# Patient Record
Sex: Female | Born: 1945 | Race: Black or African American | Hispanic: No | Smoking: Never smoker
Health system: Southern US, Community
[De-identification: ages and names within clinical notes are randomized; demographics above are authoritative.]

## PROBLEM LIST (undated history)

## (undated) DIAGNOSIS — Z9889 Other specified postprocedural states: Secondary | ICD-10-CM

## (undated) DIAGNOSIS — R519 Headache, unspecified: Secondary | ICD-10-CM

## (undated) DIAGNOSIS — M1990 Unspecified osteoarthritis, unspecified site: Secondary | ICD-10-CM

## (undated) DIAGNOSIS — J189 Pneumonia, unspecified organism: Secondary | ICD-10-CM

## (undated) DIAGNOSIS — K8020 Calculus of gallbladder without cholecystitis without obstruction: Secondary | ICD-10-CM

## (undated) DIAGNOSIS — E785 Hyperlipidemia, unspecified: Secondary | ICD-10-CM

## (undated) DIAGNOSIS — J811 Chronic pulmonary edema: Secondary | ICD-10-CM

## (undated) DIAGNOSIS — K219 Gastro-esophageal reflux disease without esophagitis: Secondary | ICD-10-CM

## (undated) DIAGNOSIS — T4145XA Adverse effect of unspecified anesthetic, initial encounter: Secondary | ICD-10-CM

## (undated) DIAGNOSIS — D649 Anemia, unspecified: Secondary | ICD-10-CM

## (undated) DIAGNOSIS — R7303 Prediabetes: Secondary | ICD-10-CM

## (undated) DIAGNOSIS — I10 Essential (primary) hypertension: Secondary | ICD-10-CM

## (undated) DIAGNOSIS — R112 Nausea with vomiting, unspecified: Secondary | ICD-10-CM

## (undated) DIAGNOSIS — F419 Anxiety disorder, unspecified: Secondary | ICD-10-CM

## (undated) DIAGNOSIS — T8859XA Other complications of anesthesia, initial encounter: Secondary | ICD-10-CM

## (undated) DIAGNOSIS — R51 Headache: Secondary | ICD-10-CM

## (undated) DIAGNOSIS — Z8719 Personal history of other diseases of the digestive system: Secondary | ICD-10-CM

## (undated) HISTORY — DX: Anxiety disorder, unspecified: F41.9

## (undated) HISTORY — PX: BREAST EXCISIONAL BIOPSY: SUR124

## (undated) HISTORY — PX: COLONOSCOPY WITH ESOPHAGOGASTRODUODENOSCOPY (EGD): SHX5779

## (undated) HISTORY — DX: Prediabetes: R73.03

## (undated) HISTORY — PX: UTERINE FIBROID SURGERY: SHX826

## (undated) HISTORY — DX: Hyperlipidemia, unspecified: E78.5

## (undated) HISTORY — DX: Calculus of gallbladder without cholecystitis without obstruction: K80.20

## (undated) HISTORY — PX: FEMUR FRACTURE SURGERY: SHX633

---

## 1999-07-12 ENCOUNTER — Encounter: Admission: RE | Admit: 1999-07-12 | Discharge: 1999-07-12 | Payer: Self-pay | Admitting: Obstetrics and Gynecology

## 1999-08-14 ENCOUNTER — Other Ambulatory Visit: Admission: RE | Admit: 1999-08-14 | Discharge: 1999-08-14 | Payer: Self-pay | Admitting: Family Medicine

## 2000-01-25 ENCOUNTER — Other Ambulatory Visit: Admission: RE | Admit: 2000-01-25 | Discharge: 2000-01-25 | Payer: Self-pay | Admitting: Obstetrics and Gynecology

## 2001-09-15 ENCOUNTER — Other Ambulatory Visit: Admission: RE | Admit: 2001-09-15 | Discharge: 2001-09-15 | Payer: Self-pay | Admitting: Obstetrics and Gynecology

## 2004-02-28 ENCOUNTER — Other Ambulatory Visit: Admission: RE | Admit: 2004-02-28 | Discharge: 2004-02-28 | Payer: Self-pay | Admitting: Obstetrics and Gynecology

## 2004-07-22 ENCOUNTER — Ambulatory Visit (HOSPITAL_COMMUNITY): Admission: RE | Admit: 2004-07-22 | Discharge: 2004-07-22 | Payer: Self-pay | Admitting: Cardiology

## 2004-07-23 ENCOUNTER — Ambulatory Visit (HOSPITAL_COMMUNITY): Admission: RE | Admit: 2004-07-23 | Discharge: 2004-07-23 | Payer: Self-pay | Admitting: Cardiology

## 2005-10-22 ENCOUNTER — Other Ambulatory Visit: Admission: RE | Admit: 2005-10-22 | Discharge: 2005-10-22 | Payer: Self-pay | Admitting: Obstetrics and Gynecology

## 2005-11-01 ENCOUNTER — Emergency Department (HOSPITAL_COMMUNITY): Admission: EM | Admit: 2005-11-01 | Discharge: 2005-11-01 | Payer: Self-pay | Admitting: Emergency Medicine

## 2006-02-17 ENCOUNTER — Encounter: Admission: RE | Admit: 2006-02-17 | Discharge: 2006-02-17 | Payer: Self-pay | Admitting: Cardiology

## 2006-03-03 ENCOUNTER — Encounter: Admission: RE | Admit: 2006-03-03 | Discharge: 2006-03-03 | Payer: Self-pay | Admitting: Cardiology

## 2006-07-29 ENCOUNTER — Encounter: Admission: RE | Admit: 2006-07-29 | Discharge: 2006-07-29 | Payer: Self-pay | Admitting: Cardiology

## 2007-09-11 ENCOUNTER — Encounter: Admission: RE | Admit: 2007-09-11 | Discharge: 2007-09-11 | Payer: Self-pay | Admitting: Cardiology

## 2009-01-21 ENCOUNTER — Emergency Department (HOSPITAL_COMMUNITY): Admission: EM | Admit: 2009-01-21 | Discharge: 2009-01-21 | Payer: Self-pay | Admitting: Emergency Medicine

## 2009-07-07 ENCOUNTER — Encounter: Admission: RE | Admit: 2009-07-07 | Discharge: 2009-07-07 | Payer: Self-pay | Admitting: Cardiology

## 2010-07-01 ENCOUNTER — Emergency Department (HOSPITAL_COMMUNITY): Admission: EM | Admit: 2010-07-01 | Discharge: 2010-07-01 | Payer: Self-pay | Admitting: Emergency Medicine

## 2013-01-29 ENCOUNTER — Other Ambulatory Visit: Payer: Self-pay | Admitting: Cardiology

## 2013-01-29 ENCOUNTER — Ambulatory Visit
Admission: RE | Admit: 2013-01-29 | Discharge: 2013-01-29 | Disposition: A | Discharge status: Home / Self Care | Payer: BC Managed Care – PPO | Source: Ambulatory Visit | Attending: Cardiology | Admitting: Cardiology

## 2013-01-29 DIAGNOSIS — R109 Unspecified abdominal pain: Secondary | ICD-10-CM

## 2013-01-29 DIAGNOSIS — R197 Diarrhea, unspecified: Secondary | ICD-10-CM

## 2013-01-29 DIAGNOSIS — R1110 Vomiting, unspecified: Secondary | ICD-10-CM

## 2013-01-29 DIAGNOSIS — F419 Anxiety disorder, unspecified: Secondary | ICD-10-CM

## 2013-02-02 ENCOUNTER — Inpatient Hospital Stay (HOSPITAL_COMMUNITY)
Admission: EM | Admit: 2013-02-02 | Discharge: 2013-02-05 | DRG: 247 | Disposition: A | Discharge status: Home / Self Care | Payer: BC Managed Care – PPO | Attending: Internal Medicine | Admitting: Internal Medicine

## 2013-02-02 ENCOUNTER — Emergency Department (HOSPITAL_COMMUNITY): Payer: BC Managed Care – PPO

## 2013-02-02 ENCOUNTER — Encounter (HOSPITAL_COMMUNITY): Payer: Self-pay | Admitting: *Deleted

## 2013-02-02 DIAGNOSIS — R197 Diarrhea, unspecified: Secondary | ICD-10-CM | POA: Diagnosis present

## 2013-02-02 DIAGNOSIS — R112 Nausea with vomiting, unspecified: Secondary | ICD-10-CM | POA: Diagnosis present

## 2013-02-02 DIAGNOSIS — I10 Essential (primary) hypertension: Secondary | ICD-10-CM | POA: Diagnosis present

## 2013-02-02 DIAGNOSIS — IMO0002 Reserved for concepts with insufficient information to code with codable children: Secondary | ICD-10-CM | POA: Diagnosis present

## 2013-02-02 DIAGNOSIS — E876 Hypokalemia: Secondary | ICD-10-CM | POA: Diagnosis present

## 2013-02-02 DIAGNOSIS — R0600 Dyspnea, unspecified: Secondary | ICD-10-CM

## 2013-02-02 DIAGNOSIS — R079 Chest pain, unspecified: Secondary | ICD-10-CM | POA: Diagnosis present

## 2013-02-02 DIAGNOSIS — R05 Cough: Secondary | ICD-10-CM | POA: Diagnosis present

## 2013-02-02 DIAGNOSIS — R110 Nausea: Secondary | ICD-10-CM | POA: Diagnosis present

## 2013-02-02 DIAGNOSIS — M79642 Pain in left hand: Secondary | ICD-10-CM | POA: Diagnosis present

## 2013-02-02 DIAGNOSIS — F17200 Nicotine dependence, unspecified, uncomplicated: Secondary | ICD-10-CM | POA: Diagnosis present

## 2013-02-02 DIAGNOSIS — R059 Cough, unspecified: Secondary | ICD-10-CM | POA: Diagnosis present

## 2013-02-02 DIAGNOSIS — M79609 Pain in unspecified limb: Principal | ICD-10-CM | POA: Diagnosis present

## 2013-02-02 DIAGNOSIS — Z79899 Other long term (current) drug therapy: Secondary | ICD-10-CM

## 2013-02-02 HISTORY — DX: Chronic pulmonary edema: J81.1

## 2013-02-02 HISTORY — DX: Essential (primary) hypertension: I10

## 2013-02-02 MED ORDER — SODIUM CHLORIDE 0.9 % IV SOLN
80.0000 mg | Freq: Once | INTRAVENOUS | Status: AC
  Administered 2013-02-03: 80 mg via INTRAVENOUS
  Filled 2013-02-02: qty 80

## 2013-02-02 MED ORDER — ASPIRIN 81 MG PO CHEW
324.0000 mg | CHEWABLE_TABLET | Freq: Once | ORAL | Status: AC
  Administered 2013-02-03: 324 mg via ORAL
  Filled 2013-02-02: qty 4

## 2013-02-02 MED ORDER — ONDANSETRON HCL 4 MG/2ML IJ SOLN
4.0000 mg | Freq: Once | INTRAMUSCULAR | Status: AC
  Administered 2013-02-03: 4 mg via INTRAVENOUS
  Filled 2013-02-02: qty 2

## 2013-02-02 MED ORDER — NITROGLYCERIN 0.4 MG SL SUBL
0.4000 mg | SUBLINGUAL_TABLET | SUBLINGUAL | Status: DC | PRN
  Administered 2013-02-03: 0.4 mg via SUBLINGUAL
  Filled 2013-02-02: qty 25

## 2013-02-02 NOTE — ED Notes (Addendum)
Pt c/o left arm pain, and SOB. Also reports feeling dizzy x's past couple days and felt her blood pressure was high. Pt states she has been vomiting and not able to keep BP meds down.

## 2013-02-03 ENCOUNTER — Observation Stay (HOSPITAL_COMMUNITY): Payer: BC Managed Care – PPO

## 2013-02-03 ENCOUNTER — Encounter (HOSPITAL_COMMUNITY): Payer: Self-pay | Admitting: Internal Medicine

## 2013-02-03 DIAGNOSIS — E876 Hypokalemia: Secondary | ICD-10-CM

## 2013-02-03 DIAGNOSIS — R05 Cough: Secondary | ICD-10-CM | POA: Diagnosis present

## 2013-02-03 DIAGNOSIS — R059 Cough, unspecified: Secondary | ICD-10-CM | POA: Diagnosis present

## 2013-02-03 DIAGNOSIS — I10 Essential (primary) hypertension: Secondary | ICD-10-CM | POA: Diagnosis present

## 2013-02-03 DIAGNOSIS — M79642 Pain in left hand: Secondary | ICD-10-CM | POA: Diagnosis present

## 2013-02-03 DIAGNOSIS — R079 Chest pain, unspecified: Secondary | ICD-10-CM | POA: Diagnosis present

## 2013-02-03 DIAGNOSIS — R112 Nausea with vomiting, unspecified: Secondary | ICD-10-CM

## 2013-02-03 LAB — BASIC METABOLIC PANEL
CO2: 33 mEq/L — ABNORMAL HIGH (ref 19–32)
Chloride: 102 mEq/L (ref 96–112)
Glucose, Bld: 101 mg/dL — ABNORMAL HIGH (ref 70–99)
Potassium: 3.2 mEq/L — ABNORMAL LOW (ref 3.5–5.1)
Sodium: 142 mEq/L (ref 135–145)

## 2013-02-03 LAB — COMPREHENSIVE METABOLIC PANEL
ALT: 21 U/L (ref 0–35)
AST: 30 U/L (ref 0–37)
Albumin: 3.4 g/dL — ABNORMAL LOW (ref 3.5–5.2)
Alkaline Phosphatase: 74 U/L (ref 39–117)
BUN: 6 mg/dL (ref 6–23)
CO2: 35 mEq/L — ABNORMAL HIGH (ref 19–32)
Calcium: 9.3 mg/dL (ref 8.4–10.5)
Chloride: 98 mEq/L (ref 96–112)
Creatinine, Ser: 0.69 mg/dL (ref 0.50–1.10)
GFR calc Af Amer: >90 mL/min (ref >90)
GFR calc non Af Amer: >90 mL/min (ref >90)
Glucose, Bld: 100 mg/dL — ABNORMAL HIGH (ref 70–99)
Potassium: 2.4 mEq/L — CL (ref 3.5–5.1)
Sodium: 141 mEq/L (ref 135–145)
Total Bilirubin: 0.3 mg/dL (ref 0.3–1.2)
Total Protein: 7 g/dL (ref 6.0–8.3)

## 2013-02-03 LAB — CBC WITH DIFFERENTIAL/PLATELET
Basophils Absolute: 0 10*3/uL (ref 0.0–0.1)
Basophils Relative: 1 % (ref 0–1)
Eosinophils Absolute: 0.1 10*3/uL (ref 0.0–0.7)
Eosinophils Relative: 1 % (ref 0–5)
HCT: 38.2 % (ref 36.0–46.0)
Hemoglobin: 12.3 g/dL (ref 12.0–15.0)
Lymphocytes Relative: 23 % (ref 12–46)
Lymphs Abs: 1.5 10*3/uL (ref 0.7–4.0)
MCH: 27.8 pg (ref 26.0–34.0)
MCHC: 32.2 g/dL (ref 30.0–36.0)
MCV: 86.4 fL (ref 78.0–100.0)
Monocytes Absolute: 0.7 10*3/uL (ref 0.1–1.0)
Monocytes Relative: 11 % (ref 3–12)
Neutro Abs: 4.2 10*3/uL (ref 1.7–7.7)
Neutrophils Relative %: 65 % (ref 43–77)
Platelets: 259 10*3/uL (ref 150–400)
RBC: 4.42 MIL/uL (ref 3.87–5.11)
RDW: 14.4 % (ref 11.5–15.5)
WBC: 6.5 10*3/uL (ref 4.0–10.5)

## 2013-02-03 LAB — CBC
Hemoglobin: 11 g/dL — ABNORMAL LOW (ref 12.0–15.0)
MCH: 27.2 pg (ref 26.0–34.0)
MCV: 86.9 fL (ref 78.0–100.0)
RBC: 4.04 MIL/uL (ref 3.87–5.11)

## 2013-02-03 LAB — TROPONIN I: Troponin I: <0.3 ng/mL (ref <0.30)

## 2013-02-03 LAB — MAGNESIUM: Magnesium: 1.9 mg/dL (ref 1.5–2.5)

## 2013-02-03 MED ORDER — BENZONATATE 100 MG PO CAPS
100.0000 mg | ORAL_CAPSULE | Freq: Three times a day (TID) | ORAL | Status: DC | PRN
  Administered 2013-02-03 – 2013-02-05 (×2): 100 mg via ORAL
  Filled 2013-02-03 (×2): qty 1

## 2013-02-03 MED ORDER — POTASSIUM CHLORIDE CRYS ER 20 MEQ PO TBCR
40.0000 meq | EXTENDED_RELEASE_TABLET | Freq: Once | ORAL | Status: AC
  Administered 2013-02-03: 40 meq via ORAL
  Filled 2013-02-03: qty 2

## 2013-02-03 MED ORDER — ONDANSETRON HCL 4 MG PO TABS
4.0000 mg | ORAL_TABLET | Freq: Four times a day (QID) | ORAL | Status: DC | PRN
  Administered 2013-02-04: 4 mg via ORAL
  Filled 2013-02-03: qty 1

## 2013-02-03 MED ORDER — HYDROCHLOROTHIAZIDE 25 MG PO TABS
25.0000 mg | ORAL_TABLET | Freq: Every day | ORAL | Status: DC
  Administered 2013-02-03 – 2013-02-04 (×2): 25 mg via ORAL
  Filled 2013-02-03 (×2): qty 1

## 2013-02-03 MED ORDER — NEBIVOLOL HCL 10 MG PO TABS
10.0000 mg | ORAL_TABLET | Freq: Every day | ORAL | Status: DC
  Administered 2013-02-03 – 2013-02-05 (×3): 10 mg via ORAL
  Filled 2013-02-03 (×3): qty 1

## 2013-02-03 MED ORDER — ACETAMINOPHEN 325 MG PO TABS: 650.0000 mg | ORAL_TABLET | Freq: Four times a day (QID) | ORAL | Status: DC | PRN

## 2013-02-03 MED ORDER — HYDROMORPHONE HCL PF 1 MG/ML IJ SOLN: 0.5000 mg | INTRAMUSCULAR | Status: DC | PRN

## 2013-02-03 MED ORDER — NITROGLYCERIN 2 % TD OINT: 1.0000 [in_us] | TOPICAL_OINTMENT | Freq: Four times a day (QID) | TRANSDERMAL | Status: DC

## 2013-02-03 MED ORDER — OXYCODONE HCL 5 MG PO TABS
5.0000 mg | ORAL_TABLET | ORAL | Status: DC | PRN
  Administered 2013-02-04: 5 mg via ORAL
  Filled 2013-02-03: qty 1

## 2013-02-03 MED ORDER — ACETAMINOPHEN 650 MG RE SUPP: 650.0000 mg | Freq: Four times a day (QID) | RECTAL | Status: DC | PRN

## 2013-02-03 MED ORDER — POTASSIUM CHLORIDE IN NACL 40-0.9 MEQ/L-% IV SOLN
INTRAVENOUS | Status: DC
  Filled 2013-02-03 (×3): qty 1000

## 2013-02-03 MED ORDER — ENOXAPARIN SODIUM 40 MG/0.4ML ~~LOC~~ SOLN
40.0000 mg | SUBCUTANEOUS | Status: DC
  Administered 2013-02-03 – 2013-02-05 (×3): 40 mg via SUBCUTANEOUS
  Filled 2013-02-03 (×3): qty 0.4

## 2013-02-03 MED ORDER — NITROGLYCERIN 2 % TD OINT: 0.5000 [in_us] | TOPICAL_OINTMENT | Freq: Four times a day (QID) | TRANSDERMAL | Status: DC

## 2013-02-03 MED ORDER — ONDANSETRON HCL 4 MG/2ML IJ SOLN: 4.0000 mg | Freq: Four times a day (QID) | INTRAMUSCULAR | Status: DC | PRN

## 2013-02-03 MED ORDER — AMLODIPINE BESYLATE 5 MG PO TABS
5.0000 mg | ORAL_TABLET | Freq: Every day | ORAL | Status: DC
  Administered 2013-02-03 – 2013-02-05 (×3): 5 mg via ORAL
  Filled 2013-02-03 (×3): qty 1

## 2013-02-03 MED ORDER — VORTIOXETINE HBR 10 MG PO TABS: 10.0000 mg | ORAL_TABLET | Freq: Every day | ORAL | Status: DC

## 2013-02-03 MED ORDER — POTASSIUM CHLORIDE 10 MEQ/100ML IV SOLN
10.0000 meq | INTRAVENOUS | Status: AC
  Administered 2013-02-03 (×3): 10 meq via INTRAVENOUS
  Filled 2013-02-03 (×3): qty 100

## 2013-02-03 MED ORDER — ZOLPIDEM TARTRATE 5 MG PO TABS: 5.0000 mg | ORAL_TABLET | Freq: Every evening | ORAL | Status: DC | PRN

## 2013-02-03 MED ORDER — ALUM & MAG HYDROXIDE-SIMETH 200-200-20 MG/5ML PO SUSP
30.0000 mL | Freq: Four times a day (QID) | ORAL | Status: DC | PRN
  Administered 2013-02-05: 30 mL via ORAL
  Filled 2013-02-03: qty 30

## 2013-02-03 MED ORDER — AMLODIPINE-VALSARTAN-HCTZ 5-160-25 MG PO TABS: 1.0000 | ORAL_TABLET | Freq: Every day | ORAL | Status: DC

## 2013-02-03 MED ORDER — PANTOPRAZOLE SODIUM 40 MG PO TBEC
40.0000 mg | DELAYED_RELEASE_TABLET | Freq: Every day | ORAL | Status: DC
  Administered 2013-02-03 – 2013-02-05 (×3): 40 mg via ORAL
  Filled 2013-02-03 (×3): qty 1

## 2013-02-03 MED ORDER — CLONIDINE HCL 0.2 MG PO TABS
0.2000 mg | ORAL_TABLET | Freq: Every day | ORAL | Status: DC
  Administered 2013-02-03 – 2013-02-04 (×2): 0.2 mg via ORAL
  Filled 2013-02-03 (×3): qty 1

## 2013-02-03 MED ORDER — IRBESARTAN 150 MG PO TABS
150.0000 mg | ORAL_TABLET | Freq: Every day | ORAL | Status: DC
  Administered 2013-02-03 – 2013-02-05 (×3): 150 mg via ORAL
  Filled 2013-02-03 (×3): qty 1

## 2013-02-03 MED ORDER — POTASSIUM CHLORIDE 10 MEQ/100ML IV SOLN
10.0000 meq | INTRAVENOUS | Status: AC
  Filled 2013-02-03 (×2): qty 100

## 2013-02-03 NOTE — ED Provider Notes (Signed)
History     CSN: [id number]  Arrival date & time 02/02/13  2256   First MD Initiated Contact with Patient 02/02/13 2323      Chief Complaint  Patient presents with  . Shortness of Breath  . Arm Pain    (Consider location/radiation/quality/duration/timing/severity/associated sxs/prior treatment) HPI 67 year old female presents to emergency department from home with complaint of left arm pain radiating into her chest and shortness of breath.  Symptoms started this evening around 7:30, and has been persistent.  Patient has had 6-8 weeks of persistent nausea and vomiting.  She's been able to keep down soup and crackers, but reports every time she tries take regular food, she vomits.  She's had some loose stools.  She denies any fevers.  No travel, no unusual foods.  At the beginning of this illness, her grandson had a stomach bug, and she thought that she picked it up from him.  Patient was seen by her primary care Dr. yesterday.  He ordered labs and x-rays.  She was to followup with him today, but the arm and chest pain, worried her.  Patient is followed by Dr Shana Chute.  Patient has been dizzy over the last few days.  She's been unable to keep down her blood pressure medicines.  No prior history of coronary artery disease, no history of hyperlipidemia.  She reports she had pulmonary edema with her last pregnancy.  Past Medical History  Diagnosis Date  . Hypertension   . Pulmonary edema     Past Surgical History  Procedure Laterality Date  . Cesarean section    . Uterine fibroid surgery    . Femur fracture surgery      Family History  Problem Relation Age of Onset  . CAD Sister   . Diabetes Sister   . Cancer Maternal Aunt     History  Substance Use Topics  . Smoking status: Not on file  . Smokeless tobacco: Not on file  . Alcohol Use: No    OB History   Grav Para Term Preterm Abortions TAB SAB Ect Mult Living                  Review of Systems  Constitutional: Positive  for fatigue.  Respiratory: Positive for cough, chest tightness and shortness of breath.   Cardiovascular: Positive for chest pain. Negative for palpitations and leg swelling.  Gastrointestinal: Positive for nausea, vomiting and diarrhea.  Neurological: Positive for dizziness and light-headedness. Negative for speech difficulty and weakness.  All other systems reviewed and are negative.    Allergies  Review of patient's allergies indicates no known allergies.  Home Medications  No current outpatient prescriptions on file.  BP 161/85  Pulse 67  Temp(Src) 98 F (36.7 C) (Oral)  Resp 20  Ht 5\' 1"  (1.549 m)  Wt 171 lb 9.6 oz (77.837 kg)  BMI 32.44 kg/m2  SpO2 95%  Physical Exam  Nursing note and vitals reviewed. Constitutional: She is oriented to person, place, and time. She appears well-developed and well-nourished.  HENT:  Head: Normocephalic and atraumatic.  Right Ear: External ear normal.  Left Ear: External ear normal.  Nose: Nose normal.  Mouth/Throat: Oropharynx is clear and moist.  Dry mucous membranes  Eyes: Conjunctivae and EOM are normal. Pupils are equal, round, and reactive to light.  Neck: Normal range of motion. Neck supple. No JVD present. No tracheal deviation present. No thyromegaly present.  Cardiovascular: Normal rate, regular rhythm, normal heart sounds and intact distal pulses.  Exam reveals no gallop and no friction rub.   No murmur heard. Pulmonary/Chest: Effort normal and breath sounds normal. No stridor. No respiratory distress. She has no wheezes. She has no rales. She exhibits no tenderness.  Coughing noted  Abdominal: Soft. Bowel sounds are normal. She exhibits no distension and no mass. There is no tenderness. There is no rebound and no guarding.  Musculoskeletal: Normal range of motion. She exhibits no edema and no tenderness.  Lymphadenopathy:    She has no cervical adenopathy.  Neurological: She is alert and oriented to person, place, and time.  She has normal reflexes. No cranial nerve deficit. She exhibits normal muscle tone. Coordination normal.  Normal strength bilaterally in upper and lower extremities.  Neg romberg.  Skin: Skin is warm and dry. No rash noted. No erythema. No pallor.  Psychiatric: She has a normal mood and affect. Her behavior is normal. Judgment and thought content normal.    ED Course  Procedures (including critical care time)  Labs Reviewed  COMPREHENSIVE METABOLIC PANEL - Abnormal; Notable for the following:    Potassium 2.4 (*)    CO2 35 (*)    Glucose, Bld 100 (*)    Albumin 3.4 (*)    All other components within normal limits  BASIC METABOLIC PANEL - Abnormal; Notable for the following:    Potassium 3.2 (*)    CO2 33 (*)    Glucose, Bld 101 (*)    All other components within normal limits  CBC - Abnormal; Notable for the following:    Hemoglobin 11.0 (*)    HCT 35.1 (*)    All other components within normal limits  CBC WITH DIFFERENTIAL  TROPONIN I  LIPASE, BLOOD  MAGNESIUM  TROPONIN I   Dg Chest 2 View  02/02/2013  *RADIOLOGY REPORT*  Clinical Data: 67 year old female shortness of breath.  CHEST - 2 VIEW  Comparison: 07/07/2009 and earlier.  Findings: Lower lung volumes.  Mild diffuse increased interstitial opacity has progressed.  Cardiac size at the upper limits of normal. Other mediastinal contours are within normal limits. Visualized tracheal air column is within normal limits.  No pneumothorax, pleural effusion or confluent pulmonary opacity. No acute osseous abnormality identified.  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 No superimposed acute findings are identified.   Original Report Authenticated By: Erskine Speed, M.D.     Date: 02/03/2013  Rate: 75  Rhythm: normal sinus rhythm  QRS Axis: normal  Intervals: normal  ST/T Wave abnormalities: nonspecific ST/T changes  Conduction  Disutrbances:none  Narrative Interpretation: LVH  Old EKG Reviewed: none available    1. Chest pain   2. Dyspnea   3. Nausea and vomiting   4. Hypokalemia   5. Hypertension       MDM  67 yo female with left arm pain, chest pain sob with 6 weeks of persistent n/v/d.  Pt is hypertensive, no prior h/o chest pain.  Concern for ACS.  Has received aspirin.  No ST elevation, negative first troponin.  D/w Dr Lovell Sheehan who will admit to [facility] to tele bed.        Nancy Mackie, MD 02/03/13 [phone]

## 2013-02-03 NOTE — Progress Notes (Signed)
INITIAL NUTRITION ASSESSMENT  DOCUMENTATION CODES Per approved criteria  -Obesity Unspecified   INTERVENTION: Diet advancement per MD discretion Provide nutritional supplements if PO intake is poor Provide Multivitamin with minerals daily  NUTRITION DIAGNOSIS: Inadequate oral intake related to intermittent nausea/vomiting as evidenced by 9% wt loss in less than 2 months per pt report.   Goal: Pt to meet >/= 90% of their estimated nutrition needs  Monitor:  Diet advancement/PO intake Weight Labs  Reason for Assessment: MST  68 y.o. female  Admitting Dx: Hypokalemia  ASSESSMENT: 66 y.o. female who presents to the ED with complaints of pain and numbness down her eft arm that started at 7pm.  Pt reports that she has nausea and vomiting on and off for the past 6 weeks causing her to drop from her usual weight of 187 lbs to 167 lbs. PTA pt was primarily eating soups, sandwiches, and crackers per pt report. Pt reports vomiting again this morning but, feeling better at time of visit.  Height: Ht Readings from Last 1 Encounters:  02/03/13 5\' 1"  (1.549 m)    Weight: Wt Readings from Last 1 Encounters:  02/03/13 171 lb 9.6 oz (77.837 kg)    Ideal Body Weight: 105 lbs  % Ideal Body Weight: 163%  Wt Readings from Last 10 Encounters:  02/03/13 171 lb 9.6 oz (77.837 kg)    Usual Body Weight: 187 lbs  % Usual Body Weight: 91%  BMI:  Body mass index is 32.44 kg/(m^2).  Estimated Nutritional Needs: Kcal: 1710-1950 Protein: 78-94 grams Fluid: 2.3 L  Skin: WDL  Diet Order: NPO  EDUCATION NEEDS: -No education needs identified at this time   Intake/Output Summary (Last 24 hours) at 02/03/13 1547 Last data filed at 02/03/13 1500  Gross per 24 hour  Intake      0 ml  Output      0 ml  Net      0 ml    Last BM: 5/6  Labs:   Recent Labs Lab 02/03/13 0010 02/03/13 0517  NA 141 142  K 2.4* 3.2*  CL 98 102  CO2 35* 33*  BUN 6 6  CREATININE 0.69 0.67   CALCIUM 9.3 8.5  MG  --  1.9  GLUCOSE 100* 101*    CBG (last 3)  No results found for this basename: GLUCAP,  in the last 72 hours  Scheduled Meds: . amLODipine  5 mg Oral Daily   And  . irbesartan  150 mg Oral Daily   And  . hydrochlorothiazide  25 mg Oral Daily  . cloNIDine  0.2 mg Oral QHS  . enoxaparin (LOVENOX) injection  40 mg Subcutaneous Q24H  . nebivolol  10 mg Oral Daily  . pantoprazole  40 mg Oral Q1200    Continuous Infusions:   Past Medical History  Diagnosis Date  . Hypertension   . Pulmonary edema     Past Surgical History  Procedure Laterality Date  . Cesarean section    . Uterine fibroid surgery    . Femur fracture surgery      Ian Malkin RD, LDN Inpatient Clinical Dietitian Pager: [phone] After Hours Pager: [phone]

## 2013-02-03 NOTE — Progress Notes (Signed)
Patient admitted earlier this morning. H&P reviewed.  Patient states that she hit her left hand against her wall and then started having pain in the hand and shooting pain up her arm and chest. Pain is much improved and now localized in left hand. She complains of swelling as well. Denies chest pain. Headache since initiation of nitroglycerin. Complains of occasional cough as well.  Vital signs reviewed. Lungs are clear to auscultation. Heart: S1S2 normal regular. No S3S$. No rubs murmurs or bruit. Abdomen is soft, NT, ND. BS present. No masses or organomegaly.  1. Left hand pain/Arm pain/Chest pain: Likely related to trauma. Obtain x ray. CP resolved. Trop neg so far. EKG with no ischemic changes. Stop NTG.  2. Nausea/Diarrhea: Ever since end of March. 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 Symptomatic control. PPI trial. Follow up with PCP.  3. Cough: CXR clear. On findings on exam. Patient reports significant passive smoking. Tessalon.  4. HTN: resume her home medications.  5. Hypokalemia: Improved. Give another dose. Mg ok.  Anticipate discharge 5/8.  Selby Foisy 02/03/2013 8:57 AM  [phone]

## 2013-02-03 NOTE — H&P (Signed)
[facility] History and Physical  ALEISHA PAONE RUE:[id number] DOB: April 22, 1946 DOA: 02/02/2013  Referring physician:  PCP: No primary provider on file.  Specialists:   Chief Complaint: Pain and Numbness Down Left ARm  HPI: Nancy Maddox is a 67 y.o. female who presents to the ED with complaints of pain and numbness down her eft arm that started at 7pm.   She denies having palpitations or SOB, She has had nausea and vomiting X 6 weeks.   In the ED she was found to have a K+level of 2.4 and she was referred for admission.      Review of Systems: The patient denies anorexia, fever, weight loss, vision loss, decreased hearing, hoarseness, chest pain, syncope, dyspnea on exertion, peripheral edema, balance deficits, hemoptysis, abdominal pain, diarrhea, constipation, hematemesis, melena, hematochezia, severe indigestion/heartburn, hematuria, incontinence, muscle weakness, suspicious skin lesions, transient blindness, difficulty walking, depression, unusual weight change, abnormal bleeding, enlarged lymph nodes, angioedema, and breast masses.    Past Medical History  Diagnosis Date  . Hypertension   . Pulmonary edema    Past Surgical History  Procedure Laterality Date  . Cesarean section    . Uterine fibroid surgery    . Femur fracture surgery       Medications:  HOME MEDS: Prior to Admission medications   Medication Sig Start Date End Date Taking? Authorizing Provider  Amlodipine-Valsartan-HCTZ (EXFORGE HCT) 5-160-25 MG TABS Take 1 tablet by mouth daily.   Yes Historical Provider, MD  cloNIDine (CATAPRES) 0.2 MG tablet Take 0.2 mg by mouth at bedtime.   Yes Historical Provider, MD  nebivolol (BYSTOLIC) 10 MG tablet Take 10 mg by mouth daily.   Yes Historical Provider, MD  Vortioxetine HBr (BRINTELLIX) 10 MG TABS Take 10 mg by mouth daily.   Yes Historical Provider, MD    Allergies:  No Known Allergies   Social History:   reports that she does not drink alcohol or use  illicit drugs. Her tobacco history is not on file.   Family History: Family History  Problem Relation Age of Onset  . CAD Sister   . Diabetes Sister   . Cancer Maternal Aunt     Physical Exam:  GEN:  Pleasant 54 year old Well nourished and Well developed African American Female  examined  and in no acute distress; cooperative with exam Filed Vitals:   02/03/13 0024 02/03/13 0030 02/03/13 0100 02/03/13 0130  BP: 163/84 133/77 141/78 130/69  Pulse: 56 76 73 76  Temp:      TempSrc:      Resp: 18 14 24 23   Weight:      SpO2: 97% 93% 91% 89%   Blood pressure 130/69, pulse 76, temperature 98.6 F (37 C), temperature source Oral, resp. rate 23, weight 76.204 kg (168 lb), SpO2 89.00%. PSYCH: She is alert and oriented x4; does not appear anxious does not appear depressed; affect is normal HEENT: Normocephalic and Atraumatic, Mucous membranes pink; PERRLA; EOM intact; Fundi:  Benign;  No scleral icterus, Nares: Patent, Oropharynx: Clear, Fair Dentition, Neck:  FROM, no cervical lymphadenopathy nor thyromegaly or carotid bruit; no JVD; Breasts:: Not examined CHEST WALL: No tenderness CHEST: Normal respiration, clear to auscultation bilaterally HEART: Regular rate and rhythm; no murmurs rubs or gallops BACK: No kyphosis or scoliosis; no CVA tenderness ABDOMEN: Positive Bowel Sounds, soft non-tender; no masses, no organomegaly. Rectal Exam: Not done EXTREMITIES: No cyanosis, clubbing or edema; no ulcerations. Genitalia: not examined PULSES: 2+ and symmetric SKIN: Normal hydration  no rash or ulceration CNS: Cranial nerves 2-12 grossly intact no focal neurologic deficit   Labs & Imaging Results for orders placed during the hospital encounter of 02/02/13 (from the past 48 hour(s))  CBC WITH DIFFERENTIAL     Status: None   Collection Time    02/03/13 12:10 AM      Result Value Range   WBC 6.5  4.0 - 10.5 K/uL   RBC 4.42  3.87 - 5.11 MIL/uL   Hemoglobin 12.3  12.0 - 15.0 g/dL   HCT  66.4  40.3 - 47.4 %   MCV 86.4  78.0 - 100.0 fL   MCH 27.8  26.0 - 34.0 pg   MCHC 32.2  30.0 - 36.0 g/dL   RDW 25.9  56.3 - 87.5 %   Platelets 259  150 - 400 K/uL   Neutrophils Relative 65  43 - 77 %   Neutro Abs 4.2  1.7 - 7.7 K/uL   Lymphocytes Relative 23  12 - 46 %   Lymphs Abs 1.5  0.7 - 4.0 K/uL   Monocytes Relative 11  3 - 12 %   Monocytes Absolute 0.7  0.1 - 1.0 K/uL   Eosinophils Relative 1  0 - 5 %   Eosinophils Absolute 0.1  0.0 - 0.7 K/uL   Basophils Relative 1  0 - 1 %   Basophils Absolute 0.0  0.0 - 0.1 K/uL  COMPREHENSIVE METABOLIC PANEL     Status: Abnormal   Collection Time    02/03/13 12:10 AM      Result Value Range   Sodium 141  135 - 145 mEq/L   Potassium 2.4 (*) 3.5 - 5.1 mEq/L   Comment: CRITICAL RESULT CALLED TO, READ BACK BY AND VERIFIED WITH:     Talbert Nan RN [id number] 02/03/13 A NAVARRO   Chloride 98  96 - 112 mEq/L   CO2 35 (*) 19 - 32 mEq/L   Glucose, Bld 100 (*) 70 - 99 mg/dL   BUN 6  6 - 23 mg/dL   Creatinine, Ser 2.95  0.50 - 1.10 mg/dL   Calcium 9.3  8.4 - 18.8 mg/dL   Total Protein 7.0  6.0 - 8.3 g/dL   Albumin 3.4 (*) 3.5 - 5.2 g/dL   AST 30  0 - 37 U/L   ALT 21  0 - 35 U/L   Alkaline Phosphatase 74  39 - 117 U/L   Total Bilirubin 0.3  0.3 - 1.2 mg/dL   GFR calc non Af Amer >90  >90 mL/min   GFR calc Af Amer >90  >90 mL/min   Comment:            The eGFR has been calculated     using the CKD EPI equation.     This calculation has not been     validated in all clinical     situations.     eGFR's persistently     <90 mL/min signify     possible Chronic Kidney Disease.  TROPONIN I     Status: None   Collection Time    02/03/13 12:10 AM      Result Value Range   Troponin I <0.30  <0.30 ng/mL   Comment:            Due to the release kinetics of cTnI,     a negative result within the first hours     of the onset of symptoms does not rule out  myocardial infarction with certainty.     If myocardial infarction is still suspected,      repeat the test at appropriate intervals.  LIPASE, BLOOD     Status: None   Collection Time    02/03/13 12:10 AM      Result Value Range   Lipase 43  11 - 59 U/L     Radiological Exams on Admission: Dg Chest 2 View  02/02/2013  *RADIOLOGY REPORT*  Clinical Data: 67 year old female shortness of breath.  CHEST - 2 VIEW  Comparison: 07/07/2009 and earlier.  Findings: Lower lung volumes.  Mild diffuse increased interstitial opacity has progressed.  Cardiac size at the upper limits of normal. Other mediastinal contours are within normal limits. Visualized tracheal air column is within normal limits.  No pneumothorax, pleural effusion or confluent pulmonary opacity. No acute osseous abnormality identified.  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 No superimposed acute findings are identified.   Original Report Authenticated By: Erskine Speed, M.D.     EKG: Independently reviewed.   Assessment/Plan Principal Problem:   Hypokalemia Active Problems:   Chest pain   Nausea and vomiting   Hypertension    1.  Hypokalemia-  Replete K+and Check Magnesium Replete if needed.     2.  Chest Pain (Atypical)-  Cycle troponins, Nitropaste, O2, ASA.    3.   Nausea and Vomiting-  Anti-Emetics PRN, May need GI Motility testing.    4.  HTN-  Monitor, PRN IV Hydralazine.     5.   DVT prophylaxis with Lovenox.       Code Status:  FULL CODE Family Communication:  No Family Present Disposition Plan:  Return to Home on Discharge  Time spent: 31 Minutes  Ron Parker [facility] Pager [phone]  If 7PM-7AM, please contact night-coverage [email or web address] Password [facility] 02/03/2013, 4:32 AM

## 2013-02-04 DIAGNOSIS — M79609 Pain in unspecified limb: Principal | ICD-10-CM

## 2013-02-04 DIAGNOSIS — R197 Diarrhea, unspecified: Secondary | ICD-10-CM

## 2013-02-04 LAB — BASIC METABOLIC PANEL
BUN: 5 mg/dL — ABNORMAL LOW (ref 6–23)
CO2: 37 mEq/L — ABNORMAL HIGH (ref 19–32)
Chloride: 99 mEq/L (ref 96–112)
GFR calc non Af Amer: 68 mL/min — ABNORMAL LOW (ref >90)
Glucose, Bld: 119 mg/dL — ABNORMAL HIGH (ref 70–99)
Potassium: 2.3 mEq/L — CL (ref 3.5–5.1)

## 2013-02-04 MED ORDER — LOPERAMIDE HCL 2 MG PO CAPS
2.0000 mg | ORAL_CAPSULE | Freq: Once | ORAL | Status: AC
  Administered 2013-02-04: 2 mg via ORAL
  Filled 2013-02-04: qty 1

## 2013-02-04 MED ORDER — POTASSIUM CHLORIDE 10 MEQ/100ML IV SOLN
10.0000 meq | INTRAVENOUS | Status: AC
  Administered 2013-02-04 (×4): 10 meq via INTRAVENOUS
  Filled 2013-02-04 (×4): qty 100

## 2013-02-04 MED ORDER — POTASSIUM CHLORIDE CRYS ER 20 MEQ PO TBCR
40.0000 meq | EXTENDED_RELEASE_TABLET | ORAL | Status: AC
  Administered 2013-02-04 (×2): 40 meq via ORAL
  Filled 2013-02-04 (×2): qty 2

## 2013-02-04 MED ORDER — SACCHAROMYCES BOULARDII 250 MG PO CAPS
250.0000 mg | ORAL_CAPSULE | Freq: Two times a day (BID) | ORAL | Status: DC
  Administered 2013-02-04 – 2013-02-05 (×3): 250 mg via ORAL
  Filled 2013-02-04 (×4): qty 1

## 2013-02-04 MED ORDER — LOPERAMIDE HCL 2 MG PO CAPS
2.0000 mg | ORAL_CAPSULE | Freq: Three times a day (TID) | ORAL | Status: DC | PRN
  Administered 2013-02-04: 2 mg via ORAL

## 2013-02-04 NOTE — Progress Notes (Signed)
[facility] PROGRESS NOTE  Nancy Maddox ION:[id number] DOB: 1946/08/01 DOA: 02/02/2013  PCP: Pola Corn, MD  Brief HPI: Patient states that she hit her left hand against her wall and then started having pain in the hand and shooting pain up her arm and chest. She then arrived at ED because she was concerned about her heart. Pain improved and localized in left hand. She complained of swelling in the hand as well. Complains of occasional cough as well. Also has been having diarrhea since end of March.  Past medical history:  Past Medical History  Diagnosis Date  . Hypertension   . Pulmonary edema     Consultants: None  Procedures: None  Antibiotics: None  Subjective: Patient says the left hand is still sore but improved. Denies chest pain. Had an episode of diarrhea this morning after eating breakfast.  Objective: Vital Signs  Filed Vitals:   02/03/13 2256 02/04/13 0215 02/04/13 0642 02/04/13 1001  BP: 122/64 123/75 122/70 120/66  Pulse: 70 64 57   Temp: 98.9 F (37.2 C) 98.4 F (36.9 C) 98.3 F (36.8 C)   TempSrc: Axillary Oral Oral   Resp: 22 20 22    Height:      Weight:      SpO2: 94% 98% 98%    Filed Weights   02/02/13 2307 02/03/13 0622  Weight: 76.204 kg (168 lb) 77.837 kg (171 lb 9.6 oz)    General appearance: alert, cooperative, appears stated age and no distress Head: Normocephalic, without obvious abnormality, atraumatic Eyes: conjunctivae/corneas clear. PERRL, EOM's intact.  Resp: clear to auscultation bilaterally Cardio: regular rate and rhythm, S1, S2 normal, no murmur, click, rub or gallop GI: soft, non-tender; bowel sounds normal; no masses,  no organomegaly Extremities: Left hand slightly swollen over dorsal aspect. Good pulses. Good ROM Pulses: 2+ and symmetric Skin: Skin color, texture, turgor normal. No rashes or lesions Neurologic: Alert and oriented x 3. No focal deficits.  Lab Results:  Basic Metabolic Panel:  Recent  Labs Lab 02/03/13 0010 02/03/13 0517 02/04/13 0555  NA 141 142 141  K 2.4* 3.2* 2.3*  CL 98 102 99  CO2 35* 33* 37*  GLUCOSE 100* 101* 119*  BUN 6 6 5*  CREATININE 0.69 0.67 0.88  CALCIUM 9.3 8.5 8.8  MG  --  1.9 2.1   Liver Function Tests:  Recent Labs Lab 02/03/13 0010  AST 30  ALT 21  ALKPHOS 74  BILITOT 0.3  PROT 7.0  ALBUMIN 3.4*    Recent Labs Lab 02/03/13 0010  LIPASE 43   No results found for this basename: AMMONIA,  in the last 168 hours CBC:  Recent Labs Lab 02/03/13 0010 02/03/13 0517  WBC 6.5 5.9  NEUTROABS 4.2  --   HGB 12.3 11.0*  HCT 38.2 35.1*  MCV 86.4 86.9  PLT 259 206   Cardiac Enzymes:  Recent Labs Lab 02/03/13 0010 02/03/13 0517 02/03/13 1035  TROPONINI <0.30 <0.30 <0.30    Studies/Results: Dg Chest 2 View  02/02/2013  *RADIOLOGY REPORT*  Clinical Data: 67 year old female shortness of breath.  CHEST - 2 VIEW  Comparison: 07/07/2009 and earlier.  Findings: Lower lung volumes.  Mild diffuse increased interstitial opacity has progressed.  Cardiac size at the upper limits of normal. Other mediastinal contours are within normal limits. Visualized tracheal air column is within normal limits.  No pneumothorax, pleural effusion or confluent pulmonary opacity. No acute osseous abnormality identified.  IMPRESSION: Lower lung volumes and increased interstitial opacity might  reflect a degree of pulmonary interstitial disease/fibrosis. No superimposed acute findings are identified.   Original Report Authenticated By: Erskine Speed, M.D.    Dg Hand Complete Left  02/03/2013  *RADIOLOGY REPORT*  Clinical Data: Injury.  Swelling and pain  LEFT HAND - COMPLETE 3+ VIEW  Comparison: None  Findings: Negative for fracture.  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  There is mild degenerative change in the DIP joints.  Accessory ossicles are present around the ulnar styloid.  IMPRESSION: Negative for  acute fracture.   Original Report Authenticated By: Janeece Riggers, M.D.     Medications:  Scheduled: . amLODipine  5 mg Oral Daily   And  . irbesartan  150 mg Oral Daily  . cloNIDine  0.2 mg Oral QHS  . enoxaparin (LOVENOX) injection  40 mg Subcutaneous Q24H  . loperamide  2 mg Oral Once  . nebivolol  10 mg Oral Daily  . pantoprazole  40 mg Oral Q1200  . potassium chloride  10 mEq Intravenous Q1 Hr x 4  . potassium chloride  40 mEq Oral Q4H  . saccharomyces boulardii  250 mg Oral BID   Continuous:  WUJ:[id number], acetaminophen, alum & mag hydroxide-simeth, benzonatate, HYDROmorphone (DILAUDID) injection, loperamide, ondansetron (ZOFRAN) IV, ondansetron, oxyCODONE, zolpidem  Assessment/Plan:  Principal Problem:   Hypokalemia Active Problems:   Chest pain   Nausea and vomiting   Hypertension   Left hand pain   Cough    Left hand pain/Arm pain/Chest pain Improved. Likely related to trauma. X ray was unremarkable for fractures. CP resolved. Trop neg x3. EKG with no ischemic changes.   Nausea/Diarrhea Ongoing since end of March. 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 Symptomatic control. PPI trial. Probiotics and imodium as needed.  Had colonoscopy 10 years ago when a polyp was discovered per patient. She will follow up for repeat study. Abdomen is soft. AXR was unremarkable. No clear indication for CT scan. She needs to follow up with PCP.  Hypokalemia Replete aggressively. Stop diuretic. Mg is ok. Repeat level later today.    Cough CXR clear. No findings on exam. Patient reports significant passive smoking. Tessalon.   History of HTN Bp better. Continue current rx.  Code Status:  Full Code DVT Prophylaxis:   Enoxaparin Family Communication: Discussed with patient.  Disposition Plan: Possible discharge 5/9.    LOS: 2 days   [facility]  [facility] Pager [phone] 02/04/2013, 11:28 AM  If 8PM-8AM, please contact night-coverage at [email or web address], password  [facility]

## 2013-02-05 DIAGNOSIS — R110 Nausea: Secondary | ICD-10-CM | POA: Diagnosis present

## 2013-02-05 LAB — BASIC METABOLIC PANEL
CO2: 35 mEq/L — ABNORMAL HIGH (ref 19–32)
Chloride: 100 mEq/L (ref 96–112)
Creatinine, Ser: 0.95 mg/dL (ref 0.50–1.10)
Glucose, Bld: 106 mg/dL — ABNORMAL HIGH (ref 70–99)

## 2013-02-05 MED ORDER — POTASSIUM CHLORIDE ER 10 MEQ PO TBCR: 20.0000 meq | EXTENDED_RELEASE_TABLET | Freq: Every day | ORAL | Status: DC

## 2013-02-05 MED ORDER — OMEPRAZOLE 20 MG PO CPDR: 20.0000 mg | DELAYED_RELEASE_CAPSULE | Freq: Every day | ORAL | Status: DC

## 2013-02-05 MED ORDER — ENSURE COMPLETE PO LIQD
237.0000 mL | ORAL | Status: DC
  Administered 2013-02-05: 237 mL via ORAL

## 2013-02-05 MED ORDER — ADULT MULTIVITAMIN W/MINERALS CH
1.0000 | ORAL_TABLET | Freq: Every day | ORAL | Status: DC
  Administered 2013-02-05: 1 via ORAL
  Filled 2013-02-05: qty 1

## 2013-02-05 MED ORDER — SACCHAROMYCES BOULARDII 250 MG PO CAPS: 250.0000 mg | ORAL_CAPSULE | Freq: Two times a day (BID) | ORAL | Status: DC

## 2013-02-05 MED ORDER — ONDANSETRON 4 MG PO TBDP: 4.0000 mg | ORAL_TABLET | Freq: Three times a day (TID) | ORAL | Status: DC | PRN

## 2013-02-05 NOTE — Progress Notes (Signed)
Discharge to home, son at bedside, discharge instructions and follow up appointments done and was given to the patient, verbalized understanding. PIV removed no s/s of infiltration or swelling noted. D/c patient no complaints of any pain or discomfort upon discharge.

## 2013-02-05 NOTE — Discharge Summary (Signed)
[facility]  Physician Discharge Summary   Patient ID: Nancy Maddox MRN: [id number] DOB/AGE: 65-Apr-1947 65 y.o.  Admit date: 02/02/2013 Discharge date: 02/05/2013  PCP: Pola Corn, MD  DISCHARGE DIAGNOSES:  Active Problems:   Hypertension   Left hand pain   Cough   Nausea alone   RECOMMENDATIONS FOR OUTPATIENT FOLLOW UP: 1. Will need repeat K level. 2. Please address chronic nausea and diarrhea  DISCHARGE CONDITION: fair  Diet recommendation: Low Sodium  Filed Weights   02/02/13 2307 02/03/13 0622  Weight: 76.204 kg (168 lb) 77.837 kg (171 lb 9.6 oz)    INITIAL HISTORY: Patient states that she hit her left hand against her wall and then started having pain in the hand and shooting pain up her arm and chest. She then arrived at ED because she was concerned about her heart. Pain improved and localized in left hand. She complained of swelling in the hand as well. Complains of occasional cough as well. Also has been having diarrhea since end of March.  Consultations:  None  Procedures:  None  HOSPITAL COURSE:   Left hand pain/Arm pain/Chest pain  No further chest pain. The left hand pain is improving. Likely related to trauma. X ray was unremarkable for fractures. Trop neg x3. EKG with no ischemic changes. She will need follow up with her PCP.  Nausea/Diarrhea  Ongoing since end of March. 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 Symptomatic control. PPI trial. Probiotics for 10 days. Had colonoscopy 10 years ago when a polyp was discovered per patient. She will follow up for repeat study. Abdomen is soft. AXR was unremarkable. No clear indication for CT scan. She needs to follow up with PCP.   Hypokalemia  Repleted. Likely due to diuretic. Will prescribe KCL for home use.   Cough  CXR clear. No findings on exam. Patient reports significant passive smoking.    History of HTN  Bp is better with reinitiation of home medications.. Continue current rx.  Patient  complains of fullness in stomach this morning after eating beef last night. Has been given Maalox. Denies chest pain. Pain in hand is improving. No other complaints. She is stable for discharge. Will need follow up with her PCP.    PERTINENT LABS:  The results of significant diagnostics from this hospitalization (including imaging, microbiology, ancillary and laboratory) are listed below for reference.     Labs: Basic Metabolic Panel:  Recent Labs Lab 02/03/13 0010 02/03/13 0517 02/04/13 0555 02/04/13 1424 02/05/13 0456  NA 141 142 141  --  139  K 2.4* 3.2* 2.3* 3.0* 4.0  CL 98 102 99  --  100  CO2 35* 33* 37*  --  35*  GLUCOSE 100* 101* 119*  --  106*  BUN 6 6 5*  --  8  CREATININE 0.69 0.67 0.88  --  0.95  CALCIUM 9.3 8.5 8.8  --  8.8  MG  --  1.9 2.1  --   --    Liver Function Tests:  Recent Labs Lab 02/03/13 0010  AST 30  ALT 21  ALKPHOS 74  BILITOT 0.3  PROT 7.0  ALBUMIN 3.4*    Recent Labs Lab 02/03/13 0010  LIPASE 43   CBC:  Recent Labs Lab 02/03/13 0010 02/03/13 0517  WBC 6.5 5.9  NEUTROABS 4.2  --   HGB 12.3 11.0*  HCT 38.2 35.1*  MCV 86.4 86.9  PLT 259 206   Cardiac Enzymes:  Recent Labs Lab 02/03/13 0010 02/03/13 0517  02/03/13 1035  TROPONINI <0.30 <0.30 <0.30    IMAGING STUDIES Dg Chest 2 View  02/02/2013  *RADIOLOGY REPORT*  Clinical Data: 67 year old female shortness of breath.  CHEST - 2 VIEW  Comparison: 07/07/2009 and earlier.  Findings: Lower lung volumes.  Mild diffuse increased interstitial opacity has progressed.  Cardiac size at the upper limits of normal. Other mediastinal contours are within normal limits. Visualized tracheal air column is within normal limits.  No pneumothorax, pleural effusion or confluent pulmonary opacity. No acute osseous abnormality identified.  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 No superimposed acute  findings are identified.   Original Report Authenticated By: Erskine Speed, M.D.    Dg Abd 2 Views  01/29/2013  *RADIOLOGY REPORT*  Clinical Data: Abdominal pain with diarrhea and vomiting for 1 month.  ABDOMEN - 2 VIEW  Comparison: Radiographs 09/11/2007 and 07/07/2009.  Findings: The bowel gas pattern is nonobstructive.  There are few scattered air fluid levels within nondilated bowel on the erect examination.  There is no free intraperitoneal air or suspicious calcification.  Lower lumbar spondylosis and osteitis pubis are noted.  Heterotopic ossification is noted adjacent to the left femoral greater trochanter, unchanged.  IMPRESSION: No acute abdominal findings.   Original Report Authenticated By: Carey Bullocks, M.D.    Dg Hand Complete Left  02/03/2013  *RADIOLOGY REPORT*  Clinical Data: Injury.  Swelling and pain  LEFT HAND - COMPLETE 3+ VIEW  Comparison: None  Findings: Negative for fracture.  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  There is mild degenerative change in the DIP joints.  Accessory ossicles are present around the ulnar styloid.  IMPRESSION: Negative for acute fracture.   Original Report Authenticated By: Janeece Riggers, M.D.     DISCHARGE EXAMINATION: Filed Vitals:   02/04/13 1001 02/04/13 2157 02/05/13 0046 02/05/13 0457  BP: 120/66 138/71 103/66 118/63  Pulse:  64 67 60  Temp:  98.1 F (36.7 C) 98.1 F (36.7 C) 98 F (36.7 C)  TempSrc:  Oral Oral Oral  Resp:  16 16 20   Height:      Weight:      SpO2:  95% 93% 95%   General appearance: alert, cooperative, appears stated age and no distress Resp: clear to auscultation bilaterally Cardio: regular rate and rhythm, S1, S2 normal, no murmur, click, rub or gallop GI: soft, non-tender; bowel sounds normal; no masses,  no organomegaly Neurologic: Alert and oriented X 3, No focal deficits.  DISPOSITION: Home  Discharge Orders   Future Orders Complete By Expires      Diet - low sodium heart healthy  As directed     Discharge instructions  As directed     Comments:      Please follow up with your PCP. Please have your doctor check your potassium level at follow up. Discuss with your doctor regarding the diarrhea and nausea    Increase activity slowly  As directed        ALLERGIES: No Known Allergies  Current Discharge Medication List    START taking these medications   Details  omeprazole (PRILOSEC) 20 MG capsule Take 1 capsule (20 mg total) by mouth daily. Qty: 30 capsule, Refills: 0    ondansetron (ZOFRAN-ODT) 4 MG disintegrating tablet Take 1 tablet (4 mg total) by mouth every 8 (eight) hours as needed for nausea. Qty: 20 tablet, Refills: 0    potassium chloride (K-DUR)  10 MEQ tablet Take 2 tablets (20 mEq total) by mouth daily. Qty: 30 tablet, Refills: 0    saccharomyces boulardii (FLORASTOR) 250 MG capsule Take 1 capsule (250 mg total) by mouth 2 (two) times daily. Qty: 20 capsule, Refills: 0      CONTINUE these medications which have NOT CHANGED   Details  Amlodipine-Valsartan-HCTZ (EXFORGE HCT) 5-160-25 MG TABS Take 1 tablet by mouth daily.    cloNIDine (CATAPRES) 0.2 MG tablet Take 0.2 mg by mouth at bedtime.    nebivolol (BYSTOLIC) 10 MG tablet Take 10 mg by mouth daily.    Vortioxetine HBr (BRINTELLIX) 10 MG TABS Take 10 mg by mouth daily.       Follow-up Information   Follow up with Pola Corn, MD. Schedule an appointment as soon as possible for a visit in 1 week.   Contact information:   [address] [place] [address] [phone]       TOTAL DISCHARGE TIME: 35 mins  [facility]  [facility] Pager [phone]  02/05/2013, 9:59 AM

## 2013-02-05 NOTE — Discharge Instructions (Signed)
PLEASE FOLLOW UP WITH YOUR GASTROENTEROLOGIST FOR REPEAT COLONOSCOPY.

## 2013-02-08 NOTE — Progress Notes (Signed)
Discharge summary sent to payer through [facility]  

## 2013-10-29 ENCOUNTER — Ambulatory Visit
Admission: RE | Admit: 2013-10-29 | Discharge: 2013-10-29 | Disposition: A | Discharge status: Home / Self Care | Payer: Medicare HMO | Source: Ambulatory Visit | Attending: Cardiology | Admitting: Cardiology

## 2013-10-29 ENCOUNTER — Other Ambulatory Visit: Payer: Self-pay | Admitting: Cardiology

## 2013-10-29 DIAGNOSIS — J329 Chronic sinusitis, unspecified: Secondary | ICD-10-CM

## 2015-07-17 ENCOUNTER — Emergency Department (HOSPITAL_COMMUNITY): Payer: Medicare HMO

## 2015-07-17 ENCOUNTER — Emergency Department (HOSPITAL_COMMUNITY)
Admission: EM | Admit: 2015-07-17 | Discharge: 2015-07-17 | Disposition: A | Discharge status: Home / Self Care | Payer: Medicare HMO | Attending: Physician Assistant | Admitting: Physician Assistant

## 2015-07-17 ENCOUNTER — Encounter (HOSPITAL_COMMUNITY): Payer: Self-pay | Admitting: Emergency Medicine

## 2015-07-17 DIAGNOSIS — Y998 Other external cause status: Secondary | ICD-10-CM | POA: Diagnosis not present

## 2015-07-17 DIAGNOSIS — I10 Essential (primary) hypertension: Secondary | ICD-10-CM | POA: Diagnosis not present

## 2015-07-17 DIAGNOSIS — Z8709 Personal history of other diseases of the respiratory system: Secondary | ICD-10-CM | POA: Diagnosis not present

## 2015-07-17 DIAGNOSIS — Y9289 Other specified places as the place of occurrence of the external cause: Secondary | ICD-10-CM | POA: Insufficient documentation

## 2015-07-17 DIAGNOSIS — Z79899 Other long term (current) drug therapy: Secondary | ICD-10-CM | POA: Insufficient documentation

## 2015-07-17 DIAGNOSIS — S79911A Unspecified injury of right hip, initial encounter: Secondary | ICD-10-CM | POA: Diagnosis present

## 2015-07-17 DIAGNOSIS — M1711 Unilateral primary osteoarthritis, right knee: Secondary | ICD-10-CM | POA: Diagnosis not present

## 2015-07-17 DIAGNOSIS — N390 Urinary tract infection, site not specified: Secondary | ICD-10-CM

## 2015-07-17 DIAGNOSIS — W010XXA Fall on same level from slipping, tripping and stumbling without subsequent striking against object, initial encounter: Secondary | ICD-10-CM | POA: Diagnosis not present

## 2015-07-17 DIAGNOSIS — W19XXXA Unspecified fall, initial encounter: Secondary | ICD-10-CM

## 2015-07-17 DIAGNOSIS — M25551 Pain in right hip: Secondary | ICD-10-CM

## 2015-07-17 DIAGNOSIS — Y9389 Activity, other specified: Secondary | ICD-10-CM | POA: Diagnosis not present

## 2015-07-17 LAB — BASIC METABOLIC PANEL
Anion gap: 9 (ref 5–15)
BUN: 12 mg/dL (ref 6–20)
CO2: 28 mmol/L (ref 22–32)
CREATININE: 0.92 mg/dL (ref 0.44–1.00)
Calcium: 9.6 mg/dL (ref 8.9–10.3)
Chloride: 108 mmol/L (ref 101–111)
GFR calc Af Amer: >60 mL/min (ref >60)
Glucose, Bld: 104 mg/dL — ABNORMAL HIGH (ref 65–99)
Potassium: 3.5 mmol/L (ref 3.5–5.1)
SODIUM: 145 mmol/L (ref 135–145)

## 2015-07-17 LAB — CBC WITH DIFFERENTIAL/PLATELET
BASOS ABS: 0 10*3/uL (ref 0.0–0.1)
BASOS PCT: 0 %
EOS PCT: 0 %
Eosinophils Absolute: 0 10*3/uL (ref 0.0–0.7)
HCT: 41 % (ref 36.0–46.0)
Hemoglobin: 13.5 g/dL (ref 12.0–15.0)
LYMPHS PCT: 19 %
Lymphs Abs: 1 10*3/uL (ref 0.7–4.0)
MCH: 28.7 pg (ref 26.0–34.0)
MCHC: 32.9 g/dL (ref 30.0–36.0)
MCV: 87.2 fL (ref 78.0–100.0)
MONO ABS: 0.5 10*3/uL (ref 0.1–1.0)
Monocytes Relative: 9 %
Neutro Abs: 3.8 10*3/uL (ref 1.7–7.7)
Neutrophils Relative %: 72 %
PLATELETS: 253 10*3/uL (ref 150–400)
RBC: 4.7 MIL/uL (ref 3.87–5.11)
RDW: 13.8 % (ref 11.5–15.5)
WBC: 5.2 10*3/uL (ref 4.0–10.5)

## 2015-07-17 LAB — URINALYSIS, ROUTINE W REFLEX MICROSCOPIC
Bilirubin Urine: NEGATIVE
GLUCOSE, UA: NEGATIVE mg/dL
Ketones, ur: NEGATIVE mg/dL
NITRITE: NEGATIVE
PH: 5 (ref 5.0–8.0)
Protein, ur: NEGATIVE mg/dL
SPECIFIC GRAVITY, URINE: 1.012 (ref 1.005–1.030)
Urobilinogen, UA: 0.2 mg/dL (ref 0.0–1.0)

## 2015-07-17 LAB — URINE MICROSCOPIC-ADD ON

## 2015-07-17 LAB — TROPONIN I

## 2015-07-17 MED ORDER — CEPHALEXIN 500 MG PO CAPS: 500.0000 mg | ORAL_CAPSULE | Freq: Two times a day (BID) | ORAL | Status: DC

## 2015-07-17 MED ORDER — FENTANYL CITRATE (PF) 100 MCG/2ML IJ SOLN
50.0000 ug | Freq: Once | INTRAMUSCULAR | Status: AC
  Administered 2015-07-17: 50 ug via INTRAVENOUS
  Filled 2015-07-17: qty 2

## 2015-07-17 NOTE — ED Notes (Signed)
This is Pts 4th fall since yesterday am.  This was an observed fall.  She c/o Right hip, knee and ankle pain with movement, not palpation.  No deformity noted.  Head was not struck.  No LOC.   BP 124/82 P 78 CBG 111.

## 2015-07-17 NOTE — ED Notes (Signed)
Bed: OZ22 Expected date:  Expected time:  Means of arrival:  Comments: EMS- 69yo F, fall/hip, knee, and ankle pain

## 2015-07-17 NOTE — Discharge Instructions (Signed)
Urinary Tract Infection Urinary tract infections (UTIs) can develop anywhere along your urinary tract. Your urinary tract is your body's drainage system for removing wastes and extra water. Your urinary tract includes two kidneys, two ureters, a bladder, and a urethra. Your kidneys are a pair of bean-shaped organs. Each kidney is about the size of your fist. They are located below your ribs, one on each side of your spine. CAUSES Infections are caused by microbes, which are microscopic organisms, including fungi, viruses, and bacteria. These organisms are so small that they can only be seen through a microscope. Bacteria are the microbes that most commonly cause UTIs. SYMPTOMS  Symptoms of UTIs may vary by age and gender of the patient and by the location of the infection. Symptoms in young women typically include a frequent and intense urge to urinate and a painful, burning feeling in the bladder or urethra during urination. Older women and men are more likely to be tired, shaky, and weak and have muscle aches and abdominal pain. A fever may mean the infection is in your kidneys. Other symptoms of a kidney infection include pain in your back or sides below the ribs, nausea, and vomiting. DIAGNOSIS To diagnose a UTI, your caregiver will ask you about your symptoms. Your caregiver will also ask you to provide a urine sample. The urine sample will be tested for bacteria and white blood cells. White blood cells are made by your body to help fight infection. TREATMENT  Typically, UTIs can be treated with medication. Because most UTIs are caused by a bacterial infection, they usually can be treated with the use of antibiotics. The choice of antibiotic and length of treatment depend on your symptoms and the type of bacteria causing your infection. HOME CARE INSTRUCTIONS  If you were prescribed antibiotics, take them exactly as your caregiver instructs you. Finish the medication even if you feel better after  you have only taken some of the medication.  Drink enough water and fluids to keep your urine clear or pale yellow.  Avoid caffeine, tea, and carbonated beverages. They tend to irritate your bladder.  Empty your bladder often. Avoid holding urine for long periods of time.  Empty your bladder before and after sexual intercourse.  After a bowel movement, women should cleanse from front to back. Use each tissue only once. SEEK MEDICAL CARE IF:   You have back pain.  You develop a fever.  Your symptoms do not begin to resolve within 3 days. SEEK IMMEDIATE MEDICAL CARE IF:   You have severe back pain or lower abdominal pain.  You develop chills.  You have nausea or vomiting.  You have continued burning or discomfort with urination. MAKE SURE YOU:   Understand these instructions.  Will watch your condition.  Will get help right away if you are not doing well or get worse.   This information is not intended to replace advice given to you by your health care provider. Make sure you discuss any questions you have with your health care provider.   Document Released: 06/26/2005 Document Revised: 06/07/2015 Document Reviewed: 10/25/2011 Elsevier Interactive Patient Education 2016 [place] in Hospitals, Adult As a hospital patient, your condition and the treatments you receive can increase your risk for falls. Some additional risk factors for falls in a hospital include:  Being in an unfamiliar environment.  Being on bed rest.  Your surgery.  Taking certain medicines.  Your tubing requirements, such as intravenous (IV) therapy or  catheters. It is important that you learn how to decrease fall risks while at the hospital. Below are important tips that can help prevent falls. SAFETY TIPS FOR PREVENTING FALLS Talk about your risk of falling.  Ask your health care provider why you are at risk for falling. Is it your medicine, illness, tubing placement, or  something else?  Make a plan with your health care provider to keep you safe from falls.  Ask your health care provider or pharmacist about side effects of your medicines. Some medicines can make you dizzy or affect your coordination. Ask for help.  Ask for help before getting out of bed. You may need to press your call button.  Ask for assistance in getting safely to the toilet.  Ask for a walker or cane to be put at your bedside. Ask that most of the side rails on your bed be placed up before your health care provider leaves the room.  Ask family or friends to sit with you.  Ask for things that are out of your reach, such as your glasses, hearing aids, telephone, bedside table, or call button. Follow these tips to avoid falling:  Stay lying or seated, rather than standing, while waiting for help.  Wear rubber-soled slippers or shoes whenever you walk in the hospital.  Avoid quick, sudden movements.  Change positions slowly.  Sit on the side of your bed before standing.  Stand up slowly and wait before you start to walk.  Let your health care provider know if there is a spill on the floor.  Pay careful attention to the medical equipment, electrical cords, and tubes around you.  When you need help, use your call button by your bed or in the bathroom. Wait for one of your health care providers to help you.  If you feel dizzy or unsure of your footing, return to bed and wait for assistance.  Avoid being distracted by the TV, telephone, or another person in your room.  Do not lean or support yourself on rolling objects, such as IV poles or bedside tables.   This information is not intended to replace advice given to you by your health care provider. Make sure you discuss any questions you have with your health care provider.   Document Released: 09/13/2000 Document Revised: 10/07/2014 Document Reviewed: 05/24/2012 Elsevier Interactive Patient Education [employer].

## 2015-07-17 NOTE — ED Provider Notes (Signed)
CSN: [id number]     Arrival date & time 07/17/15  1209 History   First MD Initiated Contact with Patient 07/17/15 1217     Chief Complaint  Patient presents with  . Fall     (Consider location/radiation/quality/duration/timing/severity/associated sxs/prior Treatment) HPI Comments: Pt comes in with c/o multiple falls in the last two days.she states the first fall was when she thought she was going to trip over the dog, since that fall she is not sure what is causing her to fall. She states that she is having right knee and hip pain. She has been able to put wt on the area. She denies any previous injury. She states that she has known arthritis in that knee. She has been able to wt bear. No fever, confusion, loc.  The history is provided by the patient. No language interpreter was used.    Past Medical History  Diagnosis Date  . Hypertension   . Pulmonary edema    Past Surgical History  Procedure Laterality Date  . Cesarean section    . Uterine fibroid surgery    . Femur fracture surgery     Family History  Problem Relation Age of Onset  . CAD Sister   . Diabetes Sister   . Cancer Maternal Aunt    Social History  Substance Use Topics  . Smoking status: Never Smoker   . Smokeless tobacco: None  . Alcohol Use: No   OB History    No data available     Review of Systems  All other systems reviewed and are negative.     Allergies  Review of patient's allergies indicates no known allergies.  Home Medications   Prior to Admission medications   Medication Sig Start Date End Date Taking? Authorizing Provider  omeprazole (PRILOSEC) 20 MG capsule Take 1 capsule (20 mg total) by mouth daily. 02/05/13   Bonnielee Haff, MD  ondansetron (ZOFRAN-ODT) 4 MG disintegrating tablet Take 1 tablet (4 mg total) by mouth every 8 (eight) hours as needed for nausea. 02/05/13   Bonnielee Haff, MD  potassium chloride (K-DUR) 10 MEQ tablet Take 2 tablets (20 mEq total) by mouth daily. 02/05/13    Bonnielee Haff, MD  saccharomyces boulardii (FLORASTOR) 250 MG capsule Take 1 capsule (250 mg total) by mouth 2 (two) times daily. 02/05/13   Bonnielee Haff, MD   BP 164/75 mmHg  Pulse 78  Temp(Src) 97.5 F (36.4 C) (Oral)  Resp 20  SpO2 98% Physical Exam  Constitutional: She is oriented to person, place, and time. She appears well-developed and well-nourished.  Cardiovascular: Normal rate and regular rhythm.   Pulmonary/Chest: Effort normal and breath sounds normal.  Abdominal: Soft. Bowel sounds are normal. There is no tenderness.  Musculoskeletal:  Right lateral hip pain. No shortening or rotation noted. Pt has full rom with pain. No gross deformity or swelling noted. Pulses intact  Neurological: She is alert and oriented to person, place, and time. She exhibits normal muscle tone. Coordination normal.  Skin: Skin is warm and dry.  Psychiatric: She has a normal mood and affect.  Nursing note and vitals reviewed.   ED Course  Procedures (including critical care time) Labs Review Labs Reviewed  URINALYSIS, ROUTINE W REFLEX MICROSCOPIC (NOT AT [facility]) - Abnormal; Notable for the following:    APPearance CLOUDY (*)    Hgb urine dipstick SMALL (*)    Leukocytes, UA LARGE (*)    All other components within normal limits  BASIC METABOLIC PANEL - Abnormal;  Notable for the following:    Glucose, Bld 104 (*)    All other components within normal limits  URINE MICROSCOPIC-ADD ON - Abnormal; Notable for the following:    Squamous Epithelial / LPF MANY (*)    Bacteria, UA MANY (*)    All other components within normal limits  URINE CULTURE  TROPONIN I  CBC WITH DIFFERENTIAL/PLATELET    Imaging Review Ct Head Wo Contrast  07/17/2015  CLINICAL DATA:  69 year old female with a history of a fall. No loss of consciousness EXAM: CT HEAD WITHOUT CONTRAST TECHNIQUE: Contiguous axial images were obtained from the base of the skull through the vertex without intravenous contrast. COMPARISON:   None. FINDINGS: Unremarkable appearance of the calvarium without acute fracture or aggressive lesion. Unremarkable appearance of the scalp soft tissues. Unremarkable appearance of the bilateral orbits. Mastoid air cells are clear. No significant paranasal sinus disease No acute intracranial hemorrhage.  No midline shift or mass effect. Unremarkable configuration of the ventricles. Focal hypodensity in the right frontal white matter (image 14). Confluent hypodensity in the periventricular white matter bilaterally. Intracranial atherosclerotic calcifications. IMPRESSION: No CT evidence of acute intracranial abnormality. 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 Signed, Dulcy Fanny. Earleen Newport, DO [facility] Electronically Signed   By: Corrie Mckusick D.O.   On: 07/17/2015 13:18   I have personally reviewed and evaluated these images and lab results as part of my medical decision-making.   EKG Interpretation   Date/Time:  Monday July 17 2015 13:23:11 EDT Ventricular Rate:  73 PR Interval:  174 QRS Duration: 89 QT Interval:  415 QTC Calculation: 457 R Axis:   -82 Text Interpretation:  Sinus rhythm Left axis deviation Probable  anteroseptal infarct, acute Lateral leads are also involved no acute  ischemia No significant change since last tracing Confirmed by Gerald Leitz ([id number]) on 07/17/2015 1:46:03 PM      MDM   Final diagnoses:  UTI (lower urinary tract infection)  Fall, initial encounter  Hip pain, right  Arthritis of knee, right    Pt is able to ambulate with walker which is pts baseline. Pt started on keflex for uti. No sign of sepsis. Son here with pt and he lives with her. Discussed strict return precautions with pt and son    Glendell Docker, NP 07/17/15 Barahona Mackuen, MD 07/17/15 1649

## 2015-07-19 LAB — URINE CULTURE

## 2016-02-29 ENCOUNTER — Telehealth: Payer: Self-pay | Admitting: *Deleted

## 2016-02-29 ENCOUNTER — Ambulatory Visit (HOSPITAL_COMMUNITY)
Admission: RE | Admit: 2016-02-29 | Discharge: 2016-02-29 | Disposition: A | Discharge status: Home / Self Care | Payer: Medicare HMO | Source: Ambulatory Visit | Attending: Physician Assistant | Admitting: Physician Assistant

## 2016-02-29 ENCOUNTER — Ambulatory Visit (INDEPENDENT_AMBULATORY_CARE_PROVIDER_SITE_OTHER): Payer: Medicare HMO | Admitting: Physician Assistant

## 2016-02-29 VITALS — BP 130/76 | HR 77 | Temp 97.9°F | Resp 15 | Ht 61.0 in | Wt 164.6 lb

## 2016-02-29 DIAGNOSIS — W19XXXA Unspecified fall, initial encounter: Secondary | ICD-10-CM | POA: Diagnosis not present

## 2016-02-29 DIAGNOSIS — R51 Headache: Secondary | ICD-10-CM | POA: Diagnosis not present

## 2016-02-29 DIAGNOSIS — R42 Dizziness and giddiness: Secondary | ICD-10-CM | POA: Insufficient documentation

## 2016-02-29 DIAGNOSIS — S0990XA Unspecified injury of head, initial encounter: Secondary | ICD-10-CM | POA: Diagnosis not present

## 2016-02-29 DIAGNOSIS — R479 Unspecified speech disturbances: Secondary | ICD-10-CM | POA: Diagnosis not present

## 2016-02-29 DIAGNOSIS — R519 Headache, unspecified: Secondary | ICD-10-CM

## 2016-02-29 DIAGNOSIS — R112 Nausea with vomiting, unspecified: Secondary | ICD-10-CM | POA: Insufficient documentation

## 2016-02-29 DIAGNOSIS — R2681 Unsteadiness on feet: Secondary | ICD-10-CM | POA: Diagnosis not present

## 2016-02-29 DIAGNOSIS — H5461 Unqualified visual loss, right eye, normal vision left eye: Secondary | ICD-10-CM | POA: Diagnosis not present

## 2016-02-29 DIAGNOSIS — R202 Paresthesia of skin: Secondary | ICD-10-CM

## 2016-02-29 LAB — POCT URINALYSIS DIP (MANUAL ENTRY)
Bilirubin, UA: NEGATIVE
Blood, UA: NEGATIVE
Glucose, UA: NEGATIVE
Ketones, POC UA: NEGATIVE
NITRITE UA: NEGATIVE
PROTEIN UA: NEGATIVE
SPEC GRAV UA: 1.025
UROBILINOGEN UA: 0.2
pH, UA: 5

## 2016-02-29 LAB — COMPREHENSIVE METABOLIC PANEL
ALK PHOS: 49 U/L (ref 33–130)
ALT: 16 U/L (ref 6–29)
AST: 20 U/L (ref 10–35)
Albumin: 4.4 g/dL (ref 3.6–5.1)
BUN: 14 mg/dL (ref 7–25)
CALCIUM: 10.2 mg/dL (ref 8.6–10.4)
CO2: 31 mmol/L (ref 20–31)
Chloride: 100 mmol/L (ref 98–110)
Creat: 0.83 mg/dL (ref 0.50–0.99)
GLUCOSE: 87 mg/dL (ref 65–99)
POTASSIUM: 4 mmol/L (ref 3.5–5.3)
Sodium: 141 mmol/L (ref 135–146)
Total Bilirubin: 0.3 mg/dL (ref 0.2–1.2)
Total Protein: 7.4 g/dL (ref 6.1–8.1)

## 2016-02-29 LAB — POCT CBC
Granulocyte percent: 59.4 %G (ref 37–80)
HCT, POC: 38.5 % (ref 37.7–47.9)
Hemoglobin: 13.1 g/dL (ref 12.2–16.2)
Lymph, poc: 1.5 (ref 0.6–3.4)
MCH, POC: 30.2 pg (ref 27–31.2)
MCHC: 34.2 g/dL (ref 31.8–35.4)
MCV: 88.3 fL (ref 80–97)
MID (CBC): 0.2 (ref 0–0.9)
MPV: 8.3 fL (ref 0–99.8)
PLATELET COUNT, POC: 211 10*3/uL (ref 142–424)
POC Granulocyte: 2.4 (ref 2–6.9)
POC LYMPH %: 36.5 % (ref 10–50)
POC MID %: 4.1 %M (ref 0–12)
RBC: 4.36 M/uL (ref 4.04–5.48)
RDW, POC: 14.9 %
WBC: 4.1 10*3/uL — AB (ref 4.6–10.2)

## 2016-02-29 LAB — POC MICROSCOPIC URINALYSIS (UMFC): MUCUS RE: ABSENT

## 2016-02-29 LAB — GLUCOSE, POCT (MANUAL RESULT ENTRY): POC Glucose: 87 mg/dl (ref 70–99)

## 2016-02-29 LAB — TSH: TSH: 0.69 mIU/L

## 2016-02-29 NOTE — Patient Instructions (Addendum)
Please go to [facility] Entrance at 3:45 pm for your scheduled CT. Go to the 1st Floor Radiology and someone will get you from there. Stay there after the exam is over to receive further instructions.     IF you received an x-ray today, you will receive an invoice from [facility]. Please contact [facility] at [phone] with questions or concerns regarding your invoice.   IF you received labwork today, you will receive an invoice from [employer]. Please contact [facility] at [phone] with questions or concerns regarding your invoice.   Our billing staff will not be able to assist you with questions regarding bills from these companies.  You will be contacted with the lab results as soon as they are available. The fastest way to get your results is to activate your My Chart account. Instructions are located on the last page of this paperwork. If you have not heard from [place] regarding the results in 2 weeks, please contact this office.     :45:4
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
## 2016-02-29 NOTE — Progress Notes (Signed)
Patient ID: Nancy Maddox, female     DOB: [birth year], 70 y.o.    MRN: [id number]  PCP: Patricia Nettle, MD  Chief Complaint  Patient presents with  . Dizziness  . Leg Injury    Bilateral. [place] last week. Fall screening completed   . Headache    After fall last week.    Subjective:    HPI  Patient presents following a fall that occurred 1 week ago, she tripped over brick, protected her glasses for fear of breaking them on her face, and fell head first onto the cement. Her son had to help her get up, "I felt like I was so sleepy afterwards, I drank coffee because everyone was telling me not to go to sleep in case of a concussion. I had a knot on my forehead, I put an icepack on it, it went down Sunday." Patient reports cracking a tooth when she fell, and difficultly speaking right afterwards, "but I thought it was because I hadn't taken my BP meds."  Another incident occurred yesterday while working outside "while pulling weeds from garden, got a pain like a migrane headache, I felt like I had fallen again, I was dizzy, and I kept trying to get up but I felt like my legs were numb, my son helped me up into the house and I was seeing dots in front of my eyes. I took my blood pressure because I thought it may be up but it was lower than normal." Advil relieved headache, woke back up this AM and the headache returned so she decided to come in.   Not taking a blood thinner.  Patient admits to one episode of vomiting last night after dinner. She has only had a piece of toast today because she states "I have a floating cyst in my sinus that makes me nauseous in the morning, I normally can't eat until at least 12pm because of it. Admits to diarrhea on and off since fall.  Patient has history of migrane headaches, with episodes of dizziness and blurred vision. She states "I have been accident prone most of my life, I tripped over train tracks in my youth causing partial blindness in my  left eye. Optometrist recently told her she was developing cataracts.  In October 2016, patient states she went to emergency department because of dizziness and a fall. Notes reviewed indicated she was diagnosis with a UTI and 4 falls in a 24 hr period, including 1 while in the ED. Imaging was completed and showed no acute abnormalities. Patient was discharged home on Cipro in the care of her son. Patient has had 5 ED visit in the past 10 years for falls and dizziness.  Patient initially answers all questions as to whether or not the symptoms have ever occurred and has to be redirected to find out if it has occurred around the time of her fall.   Prior to Admission medications   Medication Sig Start Date End Date Taking? Authorizing Provider  hydrochlorothiazide (HYDRODIURIL) 25 MG tablet Take 25 mg by mouth daily.   Yes Historical Provider, MD  ibuprofen (ADVIL,MOTRIN) 800 MG tablet Take 800 mg by mouth 2 (two) times daily as needed for headache or mild pain.   Yes Historical Provider, MD  omeprazole (PRILOSEC) 20 MG capsule Take 1 capsule (20 mg total) by mouth daily. 02/05/13  Yes Bonnielee Haff, MD  ondansetron (ZOFRAN-ODT) 4 MG disintegrating tablet Take 1 tablet (4 mg total) by mouth every 8 (  eight) hours as needed for nausea. 02/05/13  Yes Bonnielee Haff, MD  valsartan (DIOVAN) 320 MG tablet Take 320 mg by mouth daily.   Yes Historical Provider, MD  amLODipine (NORVASC) 10 MG tablet Take 10 mg by mouth daily. Reported on 02/29/2016    Historical Provider, MD     No Known Allergies   Patient Active Problem List   Diagnosis Date Noted  . 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 Unsteady gait 02/29/2016  . Nausea alone 02/05/2013  . Hypertension 02/03/2013  . Left hand pain 02/03/2013  . Cough 02/03/2013     Family History  Problem Relation Age of Onset  . CAD Sister   . Diabetes Sister   . Cancer Maternal Aunt      Social History   Social History  . Marital Status:  Widowed    Spouse Name: N/A  . Number of Children: 2  . Years of Education: N/A   Occupational History  . retired    Social History Main Topics  . Smoking status: Never Smoker   . Smokeless tobacco: Not on file  . Alcohol Use: No  . Drug Use: No  . Sexual Activity: Not on file   Other Topics Concern  . Not on file   Social History Narrative   Lives with one son who has a learning disability due to complications surrounding premature birth.   Her other sone attends [employer].        Review of Systems As above. Very difficult to discern what is happening now, or recently, as opposed to ever in her life.      Objective:  Physical Exam  Constitutional: She is oriented to person, place, and time. She appears well-developed and well-nourished. She is active and cooperative. No distress.  BP 130/76 mmHg  Pulse 77  Temp(Src) 97.9 F (36.6 C) (Oral)  Resp 15  Ht 5\' 1"  (1.549 m)  Wt 164 lb 9.6 oz (74.662 kg)  BMI 31.12 kg/m2  SpO2 98%  HENT:  Head: Normocephalic and atraumatic.  Right Ear: Hearing and external ear normal.  Left Ear: Hearing and external ear normal.  Nose: Nose normal.  Mouth/Throat: Oropharynx is clear and moist. No oropharyngeal exudate.  Eyes: Conjunctivae, EOM and lids are normal. Pupils are equal, round, and reactive to light. No scleral icterus.  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  Neck: Normal range of motion. Neck supple. No thyromegaly present.  Cardiovascular: Normal rate, regular rhythm and normal heart sounds.   Pulses:      Radial pulses are 2+ on the right side, and 2+ on the left side.  Pulmonary/Chest: Effort normal and breath sounds normal.  Lymphadenopathy:       Head (right side): No tonsillar, no preauricular, no posterior auricular and no occipital adenopathy present.       Head (left side): No tonsillar, no preauricular, no posterior auricular and no occipital adenopathy present.    She has no cervical adenopathy.        Right: No supraclavicular adenopathy present.       Left: No supraclavicular adenopathy present.  Neurological: She is alert and oriented to person, place, and time. She has normal strength. No cranial nerve deficit or sensory deficit. Coordination and gait normal.  Seems to have difficulty understanding the instructions during neuro exam, requiring several explanations.  Skin: Skin is warm, dry and intact. No rash noted. No cyanosis or erythema. Nails show no clubbing.  Psychiatric: She has a  normal mood and affect. Her speech is normal and behavior is normal.        Results for orders placed or performed in visit on 02/29/16  POCT CBC  Result Value Ref Range   WBC 4.1 (A) 4.6 - 10.2 K/uL   Lymph, poc 1.5 0.6 - 3.4   POC LYMPH PERCENT 36.5 10 - 50 %L   MID (cbc) 0.2 0 - 0.9   POC MID % 4.1 0 - 12 %M   POC Granulocyte 2.4 2 - 6.9   Granulocyte percent 59.4 37 - 80 %G   RBC 4.36 4.04 - 5.48 M/uL   Hemoglobin 13.1 12.2 - 16.2 g/dL   HCT, POC 38.5 37.7 - 47.9 %   MCV 88.3 80 - 97 fL   MCH, POC 30.2 27 - 31.2 pg   MCHC 34.2 31.8 - 35.4 g/dL   RDW, POC 14.9 %   Platelet Count, POC 211 142 - 424 K/uL   MPV 8.3 0 - 99.8 fL  POCT glucose (manual entry)  Result Value Ref Range   POC Glucose 87 70 - 99 mg/dl  POCT urinalysis dipstick  Result Value Ref Range   Color, UA yellow yellow   Clarity, UA hazy (A) clear   Glucose, UA negative negative   Bilirubin, UA negative negative   Ketones, POC UA negative negative   Spec Grav, UA 1.025    Blood, UA negative negative   pH, UA 5.0    Protein Ur, POC negative negative   Urobilinogen, UA 0.2    Nitrite, UA Negative Negative   Leukocytes, UA small (1+) (A) Negative  POCT Microscopic Urinalysis ([facility])  Result Value Ref Range   WBC,UR,HPF,POC Few (A) None WBC/hpf   RBC,UR,HPF,POC None None RBC/hpf   Bacteria None None, Too numerous to count   Mucus Absent Absent   Epithelial Cells, UR Per Microscopy Few (A) None, Too numerous to  count cells/hpf    EKG reviewed with Dr. Tamala Julian. NSR.     Assessment & Plan:  1. Intractable headache, unspecified chronicity pattern, unspecified headache type 2. Dizziness 3. Paresthesia 4. Speech abnormality 5. Fall, initial encounter 6. Head injury, initial encounter 7. Unsteady gait Unclear etiology. Given the collection of associated symptoms, CT head now. If negative, recommend neurology evaluation. - CT Head Wo Contrast; Future - POCT CBC - POCT glucose (manual entry) - POCT urinalysis dipstick - POCT Microscopic Urinalysis ([facility]) - Comprehensive metabolic panel - TSH - EKG 12-Lead   ADDENDUM: Head CT negative for acute intracranial process. Patient notified. Refer to Neurology for additional evaluation.     Fara Chute, PA-C Physician Assistant-Certified [facility]

## 2016-02-29 NOTE — Telephone Encounter (Signed)
Per Chelle CT scan is normal and she can refer her to Dr. Montez Morita or neurology.    LMOM for pt to call back to let us know which she would like to do.

## 2016-03-01 ENCOUNTER — Telehealth: Payer: Self-pay | Admitting: Emergency Medicine

## 2016-03-01 ENCOUNTER — Telehealth: Payer: Self-pay

## 2016-03-01 DIAGNOSIS — R519 Headache, unspecified: Secondary | ICD-10-CM

## 2016-03-01 DIAGNOSIS — R202 Paresthesia of skin: Secondary | ICD-10-CM

## 2016-03-01 DIAGNOSIS — R51 Headache: Principal | ICD-10-CM

## 2016-03-01 NOTE — Telephone Encounter (Signed)
Pt called back to let chelle know that she will see which every neurologist chelle would recommend  Best number [phone]

## 2016-03-01 NOTE — Telephone Encounter (Signed)
Pt ok with referral to Neurology Message routed to [facility]

## 2016-03-03 ENCOUNTER — Encounter: Payer: Self-pay | Admitting: Physician Assistant

## 2016-03-04 NOTE — Telephone Encounter (Signed)
The referral was sent to [facility] today, 03/04/16.  They will send her records for review with the appropriate provider, and they will call her to schedule after the review process is complete.

## 2016-03-05 ENCOUNTER — Encounter: Payer: Self-pay | Admitting: Physician Assistant

## 2016-03-06 ENCOUNTER — Ambulatory Visit (INDEPENDENT_AMBULATORY_CARE_PROVIDER_SITE_OTHER): Payer: Medicare HMO | Admitting: Neurology

## 2016-03-06 ENCOUNTER — Encounter: Payer: Self-pay | Admitting: Neurology

## 2016-03-06 VITALS — BP 148/90 | HR 68 | Ht 61.0 in | Wt 168.0 lb

## 2016-03-06 DIAGNOSIS — R51 Headache: Secondary | ICD-10-CM

## 2016-03-06 DIAGNOSIS — R202 Paresthesia of skin: Secondary | ICD-10-CM

## 2016-03-06 DIAGNOSIS — R269 Unspecified abnormalities of gait and mobility: Secondary | ICD-10-CM | POA: Diagnosis not present

## 2016-03-06 DIAGNOSIS — G3184 Mild cognitive impairment, so stated: Secondary | ICD-10-CM

## 2016-03-06 DIAGNOSIS — R519 Headache, unspecified: Secondary | ICD-10-CM

## 2016-03-06 MED ORDER — NORTRIPTYLINE HCL 10 MG PO CAPS: 10.0000 mg | ORAL_CAPSULE | Freq: Every day | ORAL | Status: DC

## 2016-03-06 NOTE — Patient Instructions (Addendum)
MRI brain without contrast NCS/EMG of the LEFT arm and RIGHT leg Start nortriptyline 10mg  at bedtime for tingling and headaches  Return to clinic in 3 months
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
## 2016-03-12 ENCOUNTER — Ambulatory Visit
Admission: RE | Admit: 2016-03-12 | Discharge: 2016-03-12 | Disposition: A | Discharge status: Home / Self Care | Payer: Medicare HMO | Source: Ambulatory Visit | Attending: Neurology | Admitting: Neurology

## 2016-03-12 DIAGNOSIS — R202 Paresthesia of skin: Secondary | ICD-10-CM

## 2016-03-12 DIAGNOSIS — R51 Headache: Secondary | ICD-10-CM

## 2016-03-12 DIAGNOSIS — R519 Headache, unspecified: Secondary | ICD-10-CM

## 2016-03-12 DIAGNOSIS — R269 Unspecified abnormalities of gait and mobility: Secondary | ICD-10-CM

## 2016-03-19 ENCOUNTER — Telehealth: Payer: Self-pay | Admitting: Neurology

## 2016-03-19 ENCOUNTER — Ambulatory Visit (INDEPENDENT_AMBULATORY_CARE_PROVIDER_SITE_OTHER): Payer: Medicare HMO | Admitting: Neurology

## 2016-03-19 DIAGNOSIS — R202 Paresthesia of skin: Secondary | ICD-10-CM

## 2016-03-19 DIAGNOSIS — R269 Unspecified abnormalities of gait and mobility: Secondary | ICD-10-CM

## 2016-03-19 DIAGNOSIS — M5417 Radiculopathy, lumbosacral region: Secondary | ICD-10-CM

## 2016-03-19 DIAGNOSIS — R519 Headache, unspecified: Secondary | ICD-10-CM

## 2016-03-19 DIAGNOSIS — R51 Headache: Secondary | ICD-10-CM

## 2016-03-19 DIAGNOSIS — G5602 Carpal tunnel syndrome, left upper limb: Secondary | ICD-10-CM

## 2016-03-19 NOTE — Telephone Encounter (Signed)
Please inform patient that her EMG shows left carpal tunnel syndrome and she may have a pinched nerve explaining her right foot numbness. I would recommend starting physical therapy for her right leg and if there is no improvement, MRI of the lumbar spine can be performed.  Anaira Seay K. Posey Pronto, DO

## 2016-03-19 NOTE — Telephone Encounter (Signed)
Patient notified and referral sent for PT.

## 2016-03-19 NOTE — Procedures (Signed)
[facility]  [place], [address]  [place], [place] [address] Tel: [phone] Fax:  [phone] Test Date:  03/19/2016  Patient: Nancy Maddox DOB: [birth year] Physician: Narda Amber, DO  Sex: Female Height: 5\' 2"  Ref Phys: Narda Amber, DO  ID#: [id number] Temp: 32.6C Technician: Jerilynn Mages. Dean   Patient Complaints: This is a 70 year old female referred for evaluation of left arm and right leg paresthesias.  NCV & EMG Findings: Extensive electrodiagnostic testing of the left upper extremity and right lower extremity shows: 1. Left median sensory response is absent. Left ulnar sensory responses are within normal limits. 2. Left median motor nerve showed prolonged distal onset latency (6.4 ms) and normal amplitude. Left ulnar motor responses within normal limits. 3. Right sural and superficial peroneal sensory responses are within normal limits. 4. Right peroneal motor response shows reduced amplitude at the extensor digitorum brevis; however, recording at the tibialis anterior, the motor responses within normal limits. Right tibial motor responses also within normal limits. 5. In the left upper extremity, chronic motor axon loss changes are seen affecting the abductor pollicis brevis, without accompanied active denervation. 6. Chronic motor axon loss changes are seen affecting the right L5 myotomes, without accompanied active denervation.  Impression: 1. Left median neuropathy at or distal to the wrist, consistent with clinical diagnosis of carpal tunnel syndrome. Overall, these findings are moderate to severe in degree electrically. 2. Chronic L5 radiculopathy affecting the right lower extremity, mild to moderate in degree electrically. 3. There is no evidence of a generalized sensorimotor polyneuropathy.   ___________________________ Narda Amber, DO    Nerve Conduction Studies Anti Sensory Summary Table   Site NR Peak (ms) Norm Peak (ms) P-T Amp (V) Norm P-T Amp    Left Median Anti Sensory (2nd Digit)  32.6C  Wrist NR  <3.7  >20  Right Sup Peroneal Anti Sensory (Ant Lat Mall)  32.6C  12 cm    2.4 <4 15.2 >6  Right Sural Anti Sensory (Lat Mall)  32.6C  Calf    3.4 <4.4 9.4 >6  Left Ulnar Anti Sensory (5th Digit)  32.6C  Wrist    2.9 <3.5 14.1 >10   Motor Summary Table   Site NR Onset (ms) Norm Onset (ms) O-P Amp (mV) Norm O-P Amp Site1 Site2 Delta-0 (ms) Dist (cm) Vel (m/s) Norm Vel (m/s)  Left Median Motor (Abd Poll Brev)  32.6C  Wrist    6.4 <4.4 4.7 >4 Elbow Wrist 3.9 22.0 56 >49  Elbow    10.3  4.2         Right Peroneal Motor (Ext Dig Brev)  32.6C  Ankle    4.5 <5.5 1.5 >2 B Fib Ankle 7.1 31.0 44 >41  B Fib    11.6  1.4  Poplt B Fib 1.7 10.0 59 >41  Poplt    13.3  1.2         Right Peroneal TA Motor (Tib Ant)  32.6C  Fib Head    2.2 <4.2 3.4  Poplit Fib Head 1.8 10.0 56 >40.5  Poplit    4.0 <5.7 3.2         Right Tibial Motor (Abd Hall Brev)  32.6C  Ankle    3.4 <6.1 10.7 >3.0 Knee Ankle 9.2 40.0 43 >41  Knee    12.6  6.4         Left Ulnar Motor (Abd Dig Minimi)  32.6C  Wrist    2.7 <3.5 9.0 >6 B Elbow Wrist  2.9 17.0 59 >49  B Elbow    5.6  8.4  A Elbow B Elbow 1.9 10.0 53 >49  A Elbow    7.5  7.7          EMG   Side Muscle Ins Act Fibs Psw Fasc Number Recrt Dur Dur. Amp Amp. Poly Poly. Comment  Left 1stDorInt Nml Nml Nml Nml Nml Nml Nml Nml Nml Nml Nml Nml N/A  Left Abd Poll Brev Nml Nml Nml Nml 1- Rapid Some 1+ Some 1+ Nml Nml N/A  Right AntTibialis Nml Nml Nml Nml 1- Rapid Some 1+ Some 1+ Nml Nml N/A  Right Gastroc Nml Nml Nml Nml Nml Nml Nml Nml Nml Nml Nml Nml N/A  Right Flex Dig Long Nml Nml Nml Nml 1- Rapid Some 1+ Some 1+ Nml Nml N/A  Right RectFemoris Nml Nml Nml Nml Nml Nml Nml Nml Nml Nml Nml Nml N/A  Right GluteusMed Nml Nml Nml Nml 1- Rapid Some 1+ Some 1+ Nml Nml N/A  Left PronatorTeres Nml Nml Nml Nml Nml Nml Nml Nml Nml Nml Nml Nml N/A  Left Biceps Nml Nml Nml Nml Nml Nml Nml Nml Nml Nml Nml Nml N/A   Left Triceps Nml Nml Nml Nml Nml Nml Nml Nml Nml Nml Nml Nml N/A  Left Deltoid Nml Nml Nml Nml Nml Nml Nml Nml Nml Nml Nml Nml N/A      Waveforms:
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
## 2016-06-21 ENCOUNTER — Encounter: Payer: Self-pay | Admitting: Neurology

## 2016-06-21 ENCOUNTER — Ambulatory Visit (INDEPENDENT_AMBULATORY_CARE_PROVIDER_SITE_OTHER): Payer: Medicare HMO | Admitting: Neurology

## 2016-06-21 VITALS — BP 160/90 | HR 80 | Wt 170.4 lb

## 2016-06-21 DIAGNOSIS — M5417 Radiculopathy, lumbosacral region: Secondary | ICD-10-CM | POA: Insufficient documentation

## 2016-06-21 DIAGNOSIS — G5602 Carpal tunnel syndrome, left upper limb: Secondary | ICD-10-CM | POA: Insufficient documentation

## 2016-06-21 DIAGNOSIS — R51 Headache: Secondary | ICD-10-CM

## 2016-06-21 DIAGNOSIS — G3184 Mild cognitive impairment, so stated: Secondary | ICD-10-CM

## 2016-06-21 DIAGNOSIS — R269 Unspecified abnormalities of gait and mobility: Secondary | ICD-10-CM

## 2016-06-21 DIAGNOSIS — R519 Headache, unspecified: Secondary | ICD-10-CM

## 2016-06-21 NOTE — Patient Instructions (Signed)
We will send a referral to Dr. Ronnie Derby for left carpal tunnel release Continue physical therapy Continue nortriptyline 10mg  at bedtime Return to clinic in 6 months

## 2016-12-20 ENCOUNTER — Ambulatory Visit: Payer: Medicare HMO | Admitting: Neurology

## 2016-12-20 DIAGNOSIS — Z029 Encounter for administrative examinations, unspecified: Secondary | ICD-10-CM

## 2016-12-23 ENCOUNTER — Encounter: Payer: Self-pay | Admitting: Neurology

## 2017-02-07 IMAGING — MR MR HEAD W/O CM
10 series · 48 of 48 positions shown · non-contrast
Comparison: Head CTs without contrast 02/29/2016 and earlier.

CLINICAL DATA: 69-year-old female with episodes of headaches,
numbness and tingling in both hands and feet. Unstable gait. Initial
encounter.

EXAM:
MRI HEAD WITHOUT CONTRAST
TECHNIQUE: Multiplanar, multiecho pulse sequences of the brain and surrounding
structures were obtained without intravenous contrast.

[Series 2: T1 · sagittal · 5.0mm · 0.45mm/px · 3 of 21 slices shown]
[im 1/21]
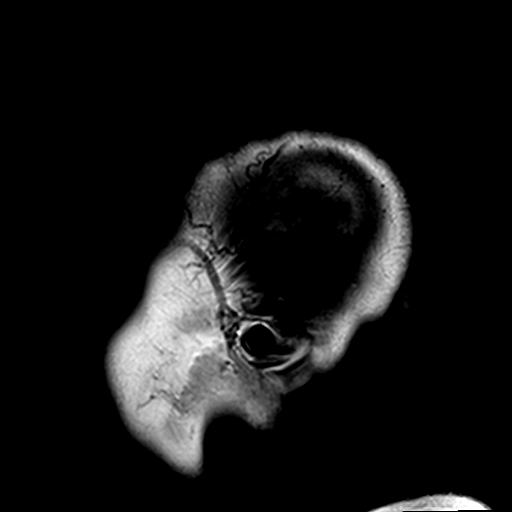
[im 11/21]
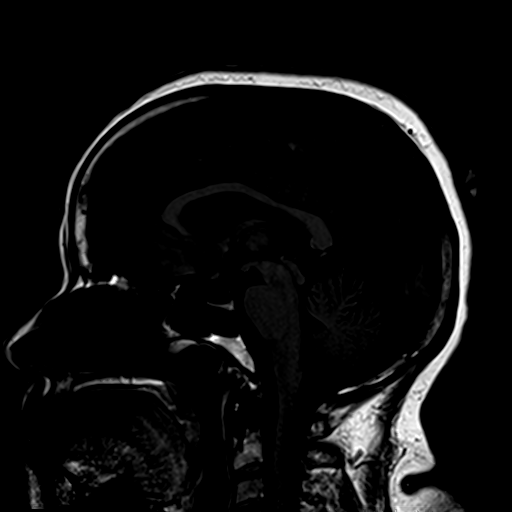
[im 21/21]
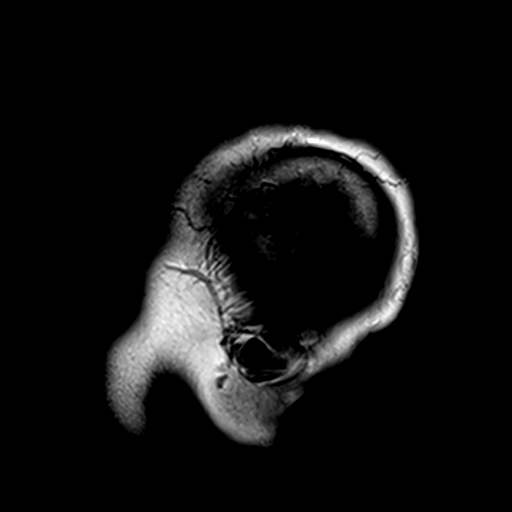

[Series 3: DWI · axial · 3.0mm · 1.80mm/px · z∈[-58,+88]mm · 9 of 97 slices shown (1 of 4)]
[im 1/97]
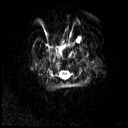
[im 13/97]
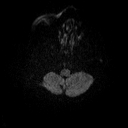
[im 25/97]
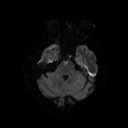
[im 37/97]
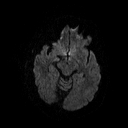
[im 49/97]
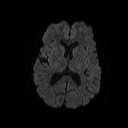
[im 61/97]
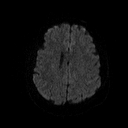
[im 73/97]
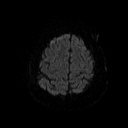
[im 85/97]
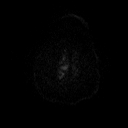
[im 97/97]
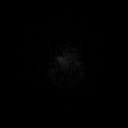

[Series 4: DWI · axial · 3.0mm · 1.80mm/px · z∈[-58,+88]mm · 5 of 49 slices shown (2 of 4)]
[im 1/49]
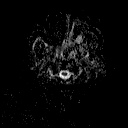
[im 13/49]
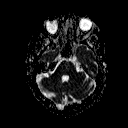
[im 25/49]
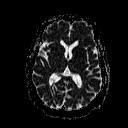
[im 37/49]
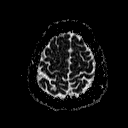
[im 49/49]
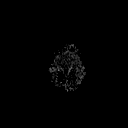

[Series 6: swi_images · axial · 2.0mm · 0.90mm/px · z∈[-63,+93]mm · 8 of 80 slices shown]
[im 1/80]
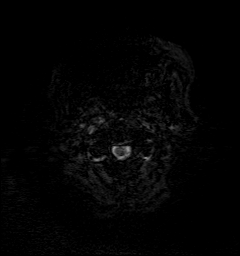
[im 12/80]
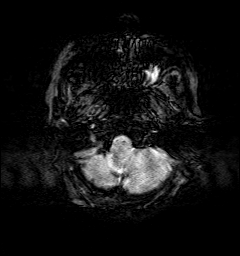
[im 23/80]
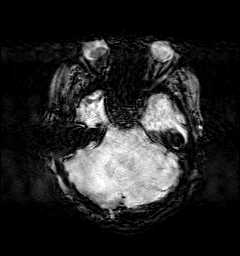
[im 34/80]
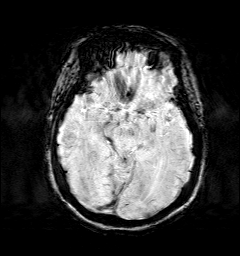
[im 46/80]
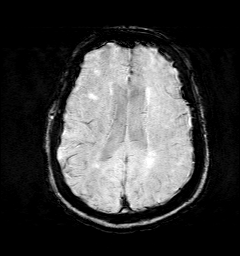
[im 57/80]
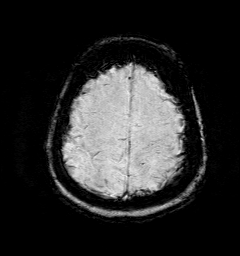
[im 68/80]
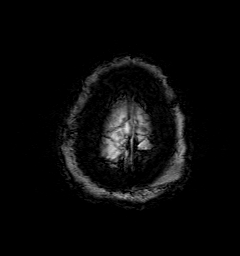
[im 80/80]
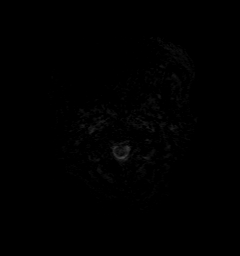

[Series 7: DWI · coronal · 5.0mm · 1.80mm/px · 6 of 66 slices shown (3 of 4)]
[im 1/66]
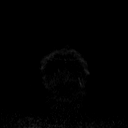
[im 14/66]
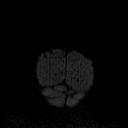
[im 27/66]
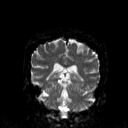
[im 40/66]
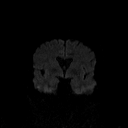
[im 53/66]
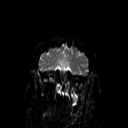
[im 66/66]
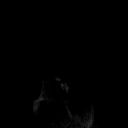

[Series 8: DWI · coronal · 5.0mm · 1.80mm/px · 3 of 34 slices shown (4 of 4)]
[im 1/34]
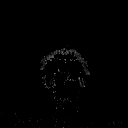
[im 17/34]
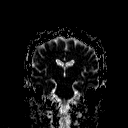
[im 34/34]
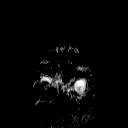

[Series 9: T2 · axial · 5.0mm · 0.51mm/px · z∈[-53,+87]mm · 2 of 22 slices shown (1 of 2)]
[im 1/22]
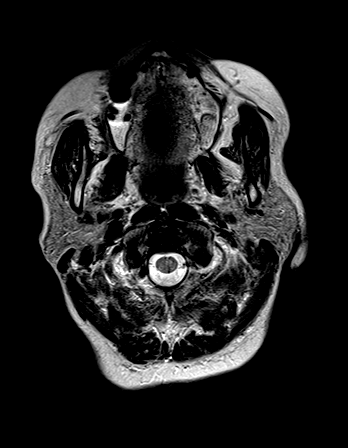
[im 22/22]
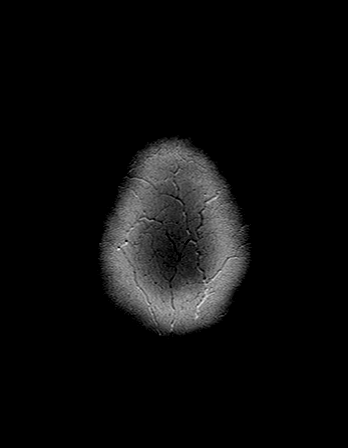

[Series 10: FLAIR · axial · 5.0mm · 0.45mm/px · z∈[-54,+86]mm · 2 of 22 slices shown]
[im 1/22]
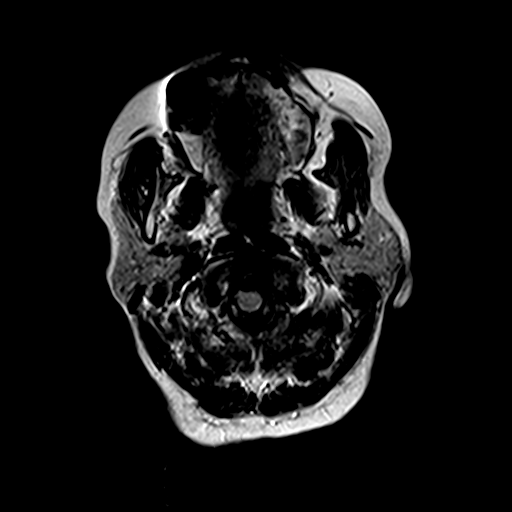
[im 22/22]
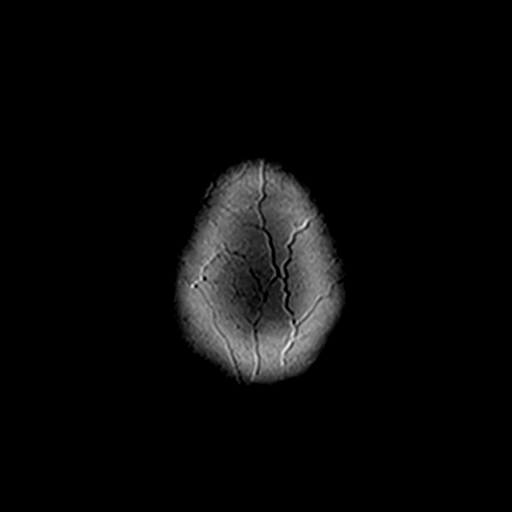

[Series 11: t1_mpr_tra · axial · 2.0mm · 0.45mm/px · z∈[-61,+95]mm · 8 of 80 slices shown]
[im 1/80]
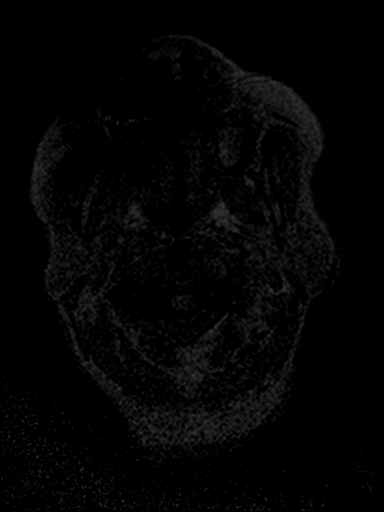
[im 12/80]
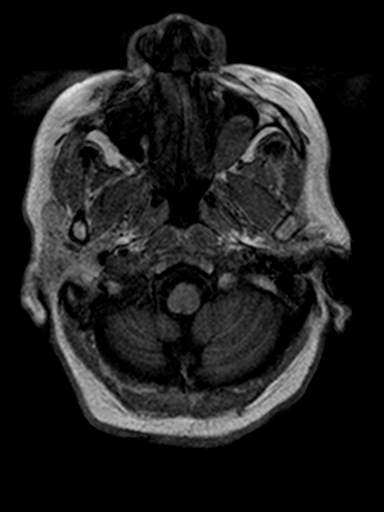
[im 23/80]
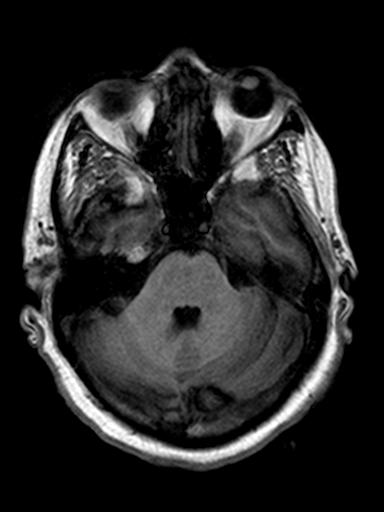
[im 34/80]
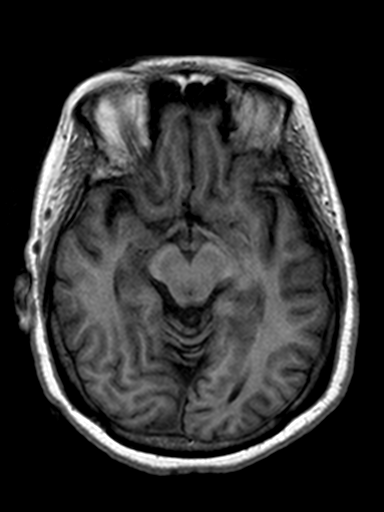
[im 46/80]
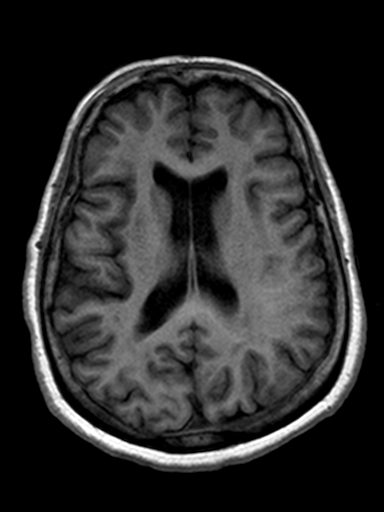
[im 57/80]
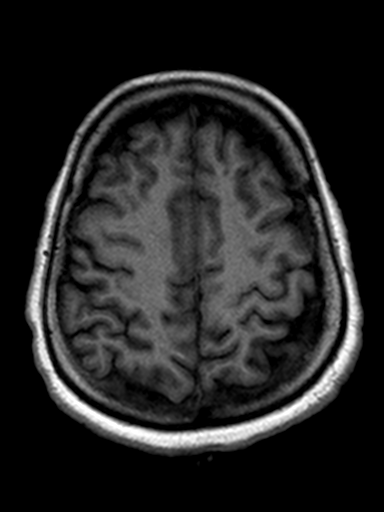
[im 68/80]
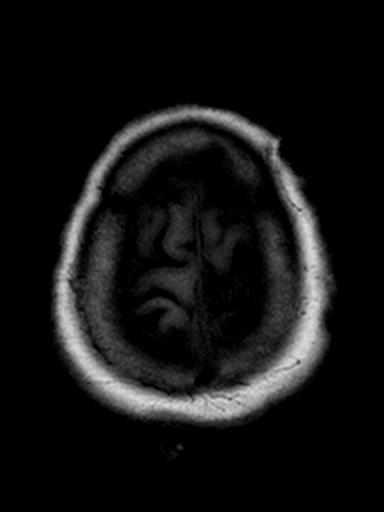
[im 80/80]
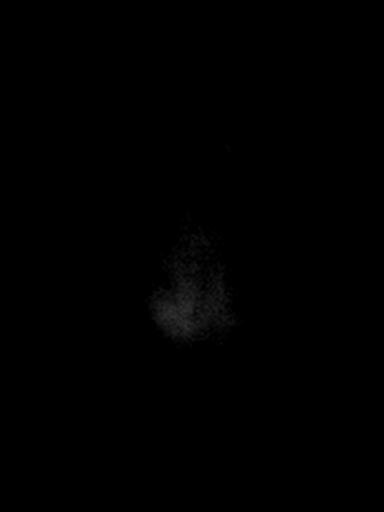

[Series 12: T2 · coronal · 5.0mm · 0.45mm/px · 2 of 25 slices shown (2 of 2)]
[im 1/25]
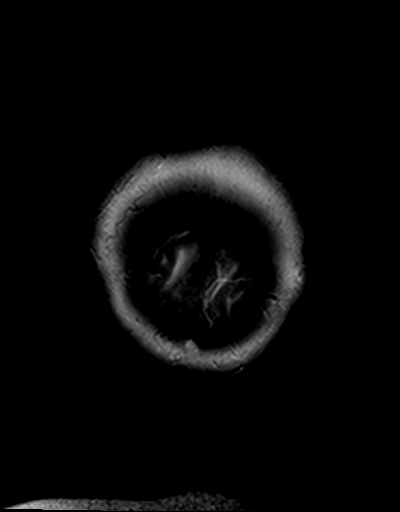
[im 25/25]
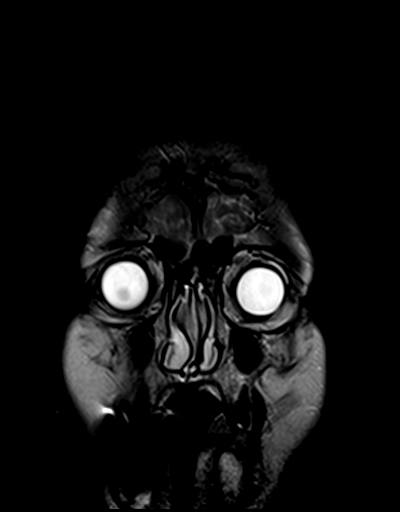

[48 of 48 positions shown; findings below may reference images not displayed]

FINDINGS: Cerebral volume is within normal limits for age. No restricted
diffusion to suggest acute infarction. No midline shift, mass
effect, evidence of mass lesion, ventriculomegaly, extra-axial
collection or acute intracranial hemorrhage. Cervicomedullary
junction and pituitary are within normal limits. Major intracranial
vascular flow voids are preserved, the distal right vertebral artery
appears dominant.

Scattered small nonspecific foci of cerebral white matter T2 and
FLAIR hyperintensity. The extent is mild for age. No cortical
encephalomalacia or chronic cerebral blood products identified. Deep
gray matter nuclei, brainstem, and cerebellum are normal. Visible
internal auditory structures appear normal.

Mastoids are clear. Left maxillary sinus probable mucous retention
cyst. Trace paranasal sinus mucosal thickening otherwise. Negative
orbit and scalp soft tissues. Normal bone marrow signal. Negative
for age visualized cervical spine.
IMPRESSION: 1.  No acute intracranial abnormality.
2. Mild for age nonspecific cerebral white matter signal changes,
most commonly due to chronic small vessel disease. Otherwise
negative noncontrast MRI appearance of the brain.

## 2017-02-20 ENCOUNTER — Emergency Department (HOSPITAL_COMMUNITY)
Admission: EM | Admit: 2017-02-20 | Discharge: 2017-02-20 | Disposition: A | Discharge status: Home / Self Care | Payer: Medicare HMO | Attending: Emergency Medicine | Admitting: Emergency Medicine

## 2017-02-20 ENCOUNTER — Encounter (HOSPITAL_COMMUNITY): Payer: Self-pay | Admitting: Emergency Medicine

## 2017-02-20 DIAGNOSIS — Z79899 Other long term (current) drug therapy: Secondary | ICD-10-CM | POA: Insufficient documentation

## 2017-02-20 DIAGNOSIS — S6992XA Unspecified injury of left wrist, hand and finger(s), initial encounter: Secondary | ICD-10-CM | POA: Diagnosis present

## 2017-02-20 DIAGNOSIS — Y999 Unspecified external cause status: Secondary | ICD-10-CM | POA: Insufficient documentation

## 2017-02-20 DIAGNOSIS — I10 Essential (primary) hypertension: Secondary | ICD-10-CM | POA: Insufficient documentation

## 2017-02-20 DIAGNOSIS — S46912A Strain of unspecified muscle, fascia and tendon at shoulder and upper arm level, left arm, initial encounter: Secondary | ICD-10-CM

## 2017-02-20 DIAGNOSIS — Y92410 Unspecified street and highway as the place of occurrence of the external cause: Secondary | ICD-10-CM | POA: Diagnosis not present

## 2017-02-20 DIAGNOSIS — Y9389 Activity, other specified: Secondary | ICD-10-CM | POA: Diagnosis not present

## 2017-02-20 MED ORDER — CYCLOBENZAPRINE HCL 5 MG PO TABS: 5.0000 mg | ORAL_TABLET | Freq: Two times a day (BID) | ORAL | 0 refills | Status: DC | PRN
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
## 2017-03-12 ENCOUNTER — Other Ambulatory Visit: Payer: Self-pay | Admitting: Internal Medicine

## 2017-03-12 ENCOUNTER — Ambulatory Visit
Admission: RE | Admit: 2017-03-12 | Discharge: 2017-03-12 | Disposition: A | Discharge status: Home / Self Care | Payer: Medicare HMO | Source: Ambulatory Visit | Attending: Internal Medicine | Admitting: Internal Medicine

## 2017-03-12 DIAGNOSIS — M25512 Pain in left shoulder: Secondary | ICD-10-CM

## 2017-03-12 DIAGNOSIS — M25532 Pain in left wrist: Secondary | ICD-10-CM

## 2017-05-31 HISTORY — PX: CARPAL TUNNEL RELEASE: SHX101

## 2017-08-25 ENCOUNTER — Encounter: Payer: Self-pay | Admitting: Physician Assistant

## 2017-09-01 ENCOUNTER — Other Ambulatory Visit: Payer: Self-pay | Admitting: Internal Medicine

## 2017-09-01 DIAGNOSIS — E2839 Other primary ovarian failure: Secondary | ICD-10-CM

## 2017-09-03 ENCOUNTER — Ambulatory Visit: Payer: Medicare HMO | Admitting: Physician Assistant

## 2017-09-03 ENCOUNTER — Encounter: Payer: Self-pay | Admitting: Physician Assistant

## 2017-09-03 ENCOUNTER — Other Ambulatory Visit (INDEPENDENT_AMBULATORY_CARE_PROVIDER_SITE_OTHER): Payer: Medicare HMO

## 2017-09-03 VITALS — BP 124/82 | HR 80 | Ht 61.0 in | Wt 164.0 lb

## 2017-09-03 DIAGNOSIS — K8020 Calculus of gallbladder without cholecystitis without obstruction: Secondary | ICD-10-CM

## 2017-09-03 DIAGNOSIS — R1011 Right upper quadrant pain: Secondary | ICD-10-CM

## 2017-09-03 DIAGNOSIS — Z1211 Encounter for screening for malignant neoplasm of colon: Secondary | ICD-10-CM

## 2017-09-03 DIAGNOSIS — K8080 Other cholelithiasis without obstruction: Secondary | ICD-10-CM

## 2017-09-03 DIAGNOSIS — R112 Nausea with vomiting, unspecified: Secondary | ICD-10-CM | POA: Diagnosis not present

## 2017-09-03 LAB — COMPREHENSIVE METABOLIC PANEL
ALK PHOS: 52 U/L (ref 39–117)
ALT: 13 U/L (ref 0–35)
AST: 16 U/L (ref 0–37)
Albumin: 4.3 g/dL (ref 3.5–5.2)
BILIRUBIN TOTAL: 0.4 mg/dL (ref 0.2–1.2)
BUN: 15 mg/dL (ref 6–23)
CALCIUM: 9.2 mg/dL (ref 8.4–10.5)
CO2: 31 meq/L (ref 19–32)
Chloride: 105 mEq/L (ref 96–112)
Creatinine, Ser: 0.91 mg/dL (ref 0.40–1.20)
GFR: 78.72 mL/min (ref >60.00)
Glucose, Bld: 93 mg/dL (ref 70–99)
Potassium: 4.1 mEq/L (ref 3.5–5.1)
Sodium: 141 mEq/L (ref 135–145)
TOTAL PROTEIN: 7.7 g/dL (ref 6.0–8.3)

## 2017-09-03 LAB — CBC WITH DIFFERENTIAL/PLATELET
BASOS ABS: 0.1 10*3/uL (ref 0.0–0.1)
Basophils Relative: 2.1 % (ref 0.0–3.0)
EOS ABS: 0.1 10*3/uL (ref 0.0–0.7)
Eosinophils Relative: 2.4 % (ref 0.0–5.0)
HEMATOCRIT: 40.2 % (ref 36.0–46.0)
Hemoglobin: 13.2 g/dL (ref 12.0–15.0)
LYMPHS ABS: 1.1 10*3/uL (ref 0.7–4.0)
LYMPHS PCT: 28.6 % (ref 12.0–46.0)
MCHC: 32.8 g/dL (ref 30.0–36.0)
MCV: 90.3 fl (ref 78.0–100.0)
Monocytes Absolute: 0.4 10*3/uL (ref 0.1–1.0)
Monocytes Relative: 10.4 % (ref 3.0–12.0)
NEUTROS ABS: 2.2 10*3/uL (ref 1.4–7.7)
NEUTROS PCT: 56.5 % (ref 43.0–77.0)
PLATELETS: 203 10*3/uL (ref 150.0–400.0)
RBC: 4.46 Mil/uL (ref 3.87–5.11)
RDW: 14.7 % (ref 11.5–15.5)
WBC: 3.9 10*3/uL — ABNORMAL LOW (ref 4.0–10.5)

## 2017-09-03 LAB — LIPASE: Lipase: 39 U/L (ref 11.0–59.0)

## 2017-09-03 MED ORDER — ONDANSETRON 4 MG PO TBDP: 4.0000 mg | ORAL_TABLET | Freq: Three times a day (TID) | ORAL | 0 refills | Status: AC

## 2017-09-03 MED ORDER — NA SULFATE-K SULFATE-MG SULF 17.5-3.13-1.6 GM/177ML PO SOLN: 1.0000 | ORAL | 0 refills | Status: DC

## 2017-09-03 NOTE — Patient Instructions (Signed)
Your physician has requested that you go to the basement for lab work before leaving today.   We have sent the following medications to your pharmacy for you to pick up at your convenience: Zofran 4 mg schedule 20-30 minutes before meals three times a day  You have been scheduled for an endoscopy and colonoscopy. Please follow the written instructions given to you at your visit today. Please pick up your prep supplies at the pharmacy within the next 1-3 days. If you use inhalers (even only as needed), please bring them with you on the day of your procedure. Your physician has requested that you go to www.startemmi.com and enter the access code given to you at your visit today. This web site gives a general overview about your procedure. However, you should still follow specific instructions given to you by our office regarding your preparation for the procedure.

## 2017-09-03 NOTE — Progress Notes (Signed)
Chief Complaint: Nausea, Vomiting, epigastric pain  HPI: [name] is a 71 year old female with a past medical history as listed below, who was referred to me by Wallene Huh, MD for a complaint of nausea and vomiting.      She had a recent right upper quadrant ultrasound 08/09/17.  Thiswass significant for a gallbladder filled with stones.  Gallbladder neoplasm or polyp was not excluded.  No definite acute cholecystitis.  Patient had labs completed 07/23/17 by primary care provider which showed a normal CBC and CMP.  TSH was also normal.    Today, it should be noted the patient is a very poor historian.  She is hard to follow.  Patient initially describes episodes of vomiting back in 2014 when her blood pressure medications were switched.  She also describes a hospitalization with low potassium and vomiting at that time as well.  Most recently, patient tells me she has had an increase in vomiting episodes since the day before Thanksgiving.  Initially, she thought this was related to all the food that she ate at that time, but these have continued.  Patient tells me that she is typically nauseous when she wakes up in the morning and tries not to eat anything right away, but typically after eating anything during the day she will have vomiting about an hour afterwards.  Associated symptoms include a right upper quadrant pain which is worsened over the past couple of weeks and is also typically worse about an hour after eating.  Patient has been using Zofran which seems to help her symptoms some.  She also describes some slteration in her bowel movements between constipation to diarrhea.    Patient also describes a vague episode of fever and chills around Thanksgiving, which has resolved since.    Patient also describes being on antibiotics for recent sinus infection including Doxycycline.    Patient also describes heartburn for which she uses Prilosec 20 mg daily which helps.    Medical history is  positive for an EGD and colonoscopy back in 1983.  Patient tells me she "may have had polyps" at that time.  She is unsure where this was done.    Patient denies current fever, chills, blood in her stool, melena, weight loss, anorexia or symptoms that awaken her at night.  Past Medical History:  Diagnosis Date  . Hypertension   . Pulmonary edema     Past Surgical History:  Procedure Laterality Date  . CESAREAN SECTION    . FEMUR FRACTURE SURGERY    . UTERINE FIBROID SURGERY      Current Outpatient Medications  Medication Sig Dispense Refill  . amLODipine (NORVASC) 10 MG tablet Take 10 mg by mouth daily. Reported on 02/29/2016    . cyclobenzaprine (FLEXERIL) 5 MG tablet Take 1 tablet (5 mg total) by mouth 2 (two) times daily as needed for muscle spasms (pain). 15 tablet 0  . hydrochlorothiazide (HYDRODIURIL) 25 MG tablet Take 25 mg by mouth daily.    Marland Kitchen ibuprofen (ADVIL,MOTRIN) 800 MG tablet Take 800 mg by mouth 2 (two) times daily as needed for headache or mild pain.    . nortriptyline (PAMELOR) 10 MG capsule Take 1 capsule (10 mg total) by mouth at bedtime. 30 capsule 5  . omeprazole (PRILOSEC) 20 MG capsule Take 1 capsule (20 mg total) by mouth daily. 30 capsule 0  . ondansetron (ZOFRAN-ODT) 4 MG disintegrating tablet Take 1 tablet (4 mg total) by mouth every 8 (eight) hours as needed  for nausea. 20 tablet 0  . valsartan (DIOVAN) 320 MG tablet Take 320 mg by mouth daily.     No current facility-administered medications for this visit.     Allergies as of 09/03/2017  . (No Known Allergies)    Family History  Problem Relation Age of Onset  . Asthma Son   . CAD Sister   . Diabetes Sister   . Cancer Maternal Aunt   . Hypertension Mother   . Kidney disease Mother   . Heart attack Father     Social History   Socioeconomic History  . Marital status: Widowed    Spouse name: Not on file  . Number of children: 2  . Years of education: Not on file  . Highest education level:  Not on file  Social Needs  . Financial resource strain: Not on file  . Food insecurity - worry: Not on file  . Food insecurity - inability: Not on file  . Transportation needs - medical: Not on file  . Transportation needs - non-medical: Not on file  Occupational History  . Occupation: retired  Tobacco Use  . Smoking status: Never Smoker  . Smokeless tobacco: Never Used  Substance and Sexual Activity  . Alcohol use: No    Alcohol/week: 0.0 oz  . Drug use: No  . Sexual activity: Not on file  Other Topics Concern  . Not on file  Social History Narrative   Lives with two sons who has a learning disability due to complications surrounding premature birth.   One son lives in [place].  Worked in Therapist, art.  Getting ready to retire.  Education: some college.    Review of Systems:    Constitutional: No weight loss HEENT: Eyes: Positive for vision changes               Ears, Nose, Throat:  No change in hearing or congestion Skin: Positive for itching and a skin rash Cardiovascular: No chest pain Respiratory: Positive for shortness of breath Gastrointestinal: See HPI and otherwise negative Genitourinary: No dysuria  Neurological: No headache, dizziness or syncope Musculoskeletal: Positive for back pain and arthritis as well as muscle pain and cramps Hematologic: No bleeding Psychiatric: No history of depression or anxiety   Physical Exam:  Vital signs: BP 124/82   Pulse 80   Ht 5\' 1"  (1.549 m)   Wt 164 lb (74.4 kg)   BMI 30.99 kg/m    Constitutional:   Pleasant Overweight AA female appears to be in NAD, Well developed, Well nourished, alert and cooperative Head:  Normocephalic and atraumatic. Eyes:   PEERL, EOMI. No icterus. Conjunctiva pink. Ears:  Normal auditory acuity. Neck:  Supple Throat: Oral cavity and pharynx without inflammation, swelling or lesion.  Respiratory: Respirations even and unlabored. Lungs clear to auscultation bilaterally.   No wheezes,  crackles, or rhonchi.  Cardiovascular: Normal S1, S2. No MRG. Regular rate and rhythm. No peripheral edema, cyanosis or pallor.  Gastrointestinal:  Soft, nondistended, Moderate RUQ ttp, No rebound or guarding. Normal bowel sounds. No appreciable masses or hepatomegaly. Rectal:  Not performed.  Msk:  Symmetrical without gross deformities. Without edema, no deformity or joint abnormality.  Neurologic:  Alert and  oriented x4;  grossly normal neurologically.  Skin:   Dry and intact without significant lesions or rashes. Psychiatric: Demonstrates good judgement and reason without abnormal affect or behaviors.  See HPI for recent imaging and labs.  Assessment: 1. Nausea and Vomiting: Sounds like patient has had vomiting  for "years", this seems to have come in spells and most recently started around Thanksgiving and has been constant since, now with a right upper quadrant pain, ultrasound showing cholelithiasis; likely this is related to cholelithiasis and patient will need a cholecystectomy in the future, but will rule out gastric origin prior to referring her 2.  Right upper quadrant pain: With above, likely related to cholelithiasis, but will proceed with EGD to consider PUD versus duodenitis versus other 3. Cholelithiasis: Seen at time of recent ultrasound, likely cause of her symptoms 4.  Screening for colorectal cancer: Patient has not had a colonoscopy since 1983, she is overdue now  Plan: 1.  Ordered labs including CBC, CMP and lipase 2.  Scheduled patient for an EGD and colonoscopy with Dr. Silverio Decamp in the [facility].  Did discuss risks, benefits, limitations and alternatives and the patient agrees to proceed. 2.  Discussed with the patient that likely her pain as well as nausea and vomiting is coming from her cholelithiasis, but will rule out gastric origin before referring her to a surgeon. 4.  Recommend the patient schedule her Zofran 4 mg 20-30 minutes prior to a meal 3 times a day as well as at  bedtime if necessary.  Refilled prescription #90 with 1 refill 5.  Patient to follow in clinic per recommendations from Dr. Silverio Decamp after time of procedures.  Ellouise Newer, PA-C [facility] 09/03/2017, 10:12 AM  Cc: Wallene Huh, MD

## 2017-09-05 NOTE — Progress Notes (Signed)
Reviewed and agree with documentation and assessment and plan. K. Veena Rosamae Rocque , MD   

## 2017-09-10 ENCOUNTER — Telehealth: Payer: Self-pay | Admitting: Physician Assistant

## 2017-09-10 NOTE — Telephone Encounter (Signed)
Patient states prep for colon on 12.28.18 is $90 and pt wants to know if she can get coupon at [facility]. Pt had ov 12.5.18

## 2017-09-10 NOTE — Telephone Encounter (Signed)
Gave patient Plenvu sample kit and typed up new instructions   Called pt to inform to pick up L/M

## 2017-09-12 ENCOUNTER — Encounter: Payer: Self-pay | Admitting: Gastroenterology

## 2017-09-26 ENCOUNTER — Ambulatory Visit (AMBULATORY_SURGERY_CENTER): Payer: Medicare HMO | Admitting: Gastroenterology

## 2017-09-26 ENCOUNTER — Other Ambulatory Visit: Payer: Self-pay

## 2017-09-26 ENCOUNTER — Encounter: Payer: Self-pay | Admitting: Gastroenterology

## 2017-09-26 VITALS — BP 128/78 | HR 71 | Temp 98.2°F | Resp 13 | Ht 61.0 in | Wt 164.0 lb

## 2017-09-26 DIAGNOSIS — D122 Benign neoplasm of ascending colon: Secondary | ICD-10-CM

## 2017-09-26 DIAGNOSIS — Z1212 Encounter for screening for malignant neoplasm of rectum: Secondary | ICD-10-CM

## 2017-09-26 DIAGNOSIS — D120 Benign neoplasm of cecum: Secondary | ICD-10-CM

## 2017-09-26 DIAGNOSIS — Z1211 Encounter for screening for malignant neoplasm of colon: Secondary | ICD-10-CM | POA: Diagnosis present

## 2017-09-26 DIAGNOSIS — R112 Nausea with vomiting, unspecified: Secondary | ICD-10-CM | POA: Diagnosis not present

## 2017-09-26 DIAGNOSIS — K228 Other specified diseases of esophagus: Secondary | ICD-10-CM | POA: Diagnosis not present

## 2017-09-26 DIAGNOSIS — D124 Benign neoplasm of descending colon: Secondary | ICD-10-CM | POA: Diagnosis not present

## 2017-09-26 MED ORDER — SODIUM CHLORIDE 0.9 % IV SOLN: 500.0000 mL | Freq: Once | INTRAVENOUS | Status: DC

## 2017-09-26 NOTE — Progress Notes (Signed)
Pt's states no medical or surgical changes since previsit or office visit. 

## 2017-09-26 NOTE — Progress Notes (Signed)
Report to PACU, RN, vss, BBS= Clear.  

## 2017-09-26 NOTE — Op Note (Signed)
[place] Patient Name: Nancy Maddox Procedure Date: 09/26/2017 3:06 PM MRN: [id number] Endoscopist: Mauri Pole , MD Age: 71 Referring MD:  Date of Birth: [birth year] Gender: Female Account #: [id number] Procedure:                Upper GI endoscopy Indications:              Persistent vomiting of unknown cause,                            Gastro-esophageal reflux disease Medicines:                Monitored Anesthesia Care Procedure:                Pre-Anesthesia Assessment:                           - Prior to the procedure, a History and Physical                            was performed, and patient medications and                            allergies were reviewed. The patient's tolerance of                            previous anesthesia was also reviewed. The risks                            and benefits of the procedure and the sedation                            options and risks were discussed with the patient.                            All questions were answered, and informed consent                            was obtained. Prior Anticoagulants: The patient has                            taken no previous anticoagulant or antiplatelet                            agents. ASA Grade Assessment: II - A patient with                            mild systemic disease. After reviewing the risks                            and benefits, the patient was deemed in                            satisfactory condition to undergo the procedure.  After obtaining informed consent, the endoscope was                            passed under direct vision. Throughout the                            procedure, the patient's blood pressure, pulse, and                            oxygen saturations were monitored continuously. The                            Endoscope was introduced through the mouth, and                            advanced to the second part of  duodenum. The upper                            GI endoscopy was accomplished without difficulty.                            The patient tolerated the procedure well. Scope In: Scope Out: Findings:                 A 5 cm hiatal hernia was present. Regular Z-line.                            Esophagus appeared normal otherwise                           Patchy moderate inflammation characterized by                            congestion (edema), erosions and erythema was found                            in the gastric antrum. Biopsies were taken with a                            cold forceps for histology. Biopsies were taken                            with a cold forceps for Helicobacter pylori testing.                           The examined duodenum was normal. Complications:            No immediate complications. Estimated Blood Loss:     Estimated blood loss was minimal. Impression:               - 5 cm hiatal hernia.                           - Gastritis. Biopsied.                           -  Normal examined duodenum. Recommendation:           - Patient has a contact number available for                            emergencies. The signs and symptoms of potential                            delayed complications were discussed with the                            patient. Return to normal activities tomorrow.                            Written discharge instructions were provided to the                            patient.                           - Resume previous diet.                           - Continue present medications.                           - Await pathology results.                           - Follow an antireflux regimen for the rest of the                            patient's life. Mauri Pole, MD 09/26/2017 3:44:42 PM This report has been signed electronically.

## 2017-09-26 NOTE — Patient Instructions (Signed)
**Handouts given on polyps, gastritis** YOU HAD AN ENDOSCOPIC PROCEDURE TODAY AT [place]:   Refer to the procedure report that was given to you for any specific questions about what was found during the examination.  If the procedure report does not answer your questions, please call your gastroenterologist to clarify.  If you requested that your care partner not be given the details of your procedure findings, then the procedure report has been included in a sealed envelope for you to review at your convenience later.  YOU SHOULD EXPECT: Some feelings of bloating in the abdomen. Passage of more gas than usual.  Walking can help get rid of the air that was put into your GI tract during the procedure and reduce the bloating. If you had a lower endoscopy (such as a colonoscopy or flexible sigmoidoscopy) you may notice spotting of blood in your stool or on the toilet paper. If you underwent a bowel prep for your procedure, you may not have a normal bowel movement for a few days.  Please Note:  You might notice some irritation and congestion in your nose or some drainage.  This is from the oxygen used during your procedure.  There is no need for concern and it should clear up in a day or so.  SYMPTOMS TO REPORT IMMEDIATELY:   Following lower endoscopy (colonoscopy or flexible sigmoidoscopy):  Excessive amounts of blood in the stool  Significant tenderness or worsening of abdominal pains  Swelling of the abdomen that is new, acute  Fever of 100F or higher   Following upper endoscopy (EGD)  Vomiting of blood or coffee ground material  New chest pain or pain under the shoulder blades  Painful or persistently difficult swallowing  New shortness of breath  Fever of 100F or higher  Black, tarry-looking stools  For urgent or emergent issues, a gastroenterologist can be reached at any hour by calling [phone].   DIET:  We do recommend a small meal at first, but then you  may proceed to your regular diet.  Drink plenty of fluids but you should avoid alcoholic beverages for 24 hours.  ACTIVITY:  You should plan to take it easy for the rest of today and you should NOT DRIVE or use heavy machinery until tomorrow (because of the sedation medicines used during the test).    FOLLOW UP: Our staff will call the number listed on your records the next business day following your procedure to check on you and address any questions or concerns that you may have regarding the information given to you following your procedure. If we do not reach you, we will leave a message.  However, if you are feeling well and you are not experiencing any problems, there is no need to return our call.  We will assume that you have returned to your regular daily activities without incident.  If any biopsies were taken you will be contacted by phone or by letter within the next 1-3 weeks.  Please call us at [phone] if you have not heard about the biopsies in 3 weeks.    SIGNATURES/CONFIDENTIALITY: You and/or your care partner have signed paperwork which will be entered into your electronic medical record.  These signatures attest to the fact that that the information above on your After Visit Summary has been reviewed and is understood.  Full responsibility of the confidentiality of this discharge information lies with you and/or your care-partner.YOU HAD AN ENDOSCOPIC PROCEDURE TODAY AT THE  [facility]:   Refer to the procedure report that was given to you for any specific questions about what was found during the examination.  If the procedure report does not answer your questions, please call your gastroenterologist to clarify.  If you requested that your care partner not be given the details of your procedure findings, then the procedure report has been included in a sealed envelope for you to review at your convenience later.  YOU SHOULD EXPECT: Some feelings of bloating in  the abdomen. Passage of more gas than usual.  Walking can help get rid of the air that was put into your GI tract during the procedure and reduce the bloating. If you had a lower endoscopy (such as a colonoscopy or flexible sigmoidoscopy) you may notice spotting of blood in your stool or on the toilet paper. If you underwent a bowel prep for your procedure, you may not have a normal bowel movement for a few days.  Please Note:  You might notice some irritation and congestion in your nose or some drainage.  This is from the oxygen used during your procedure.  There is no need for concern and it should clear up in a day or so.  SYMPTOMS TO REPORT IMMEDIATELY:   Following lower endoscopy (colonoscopy or flexible sigmoidoscopy):  Excessive amounts of blood in the stool  Significant tenderness or worsening of abdominal pains  Swelling of the abdomen that is new, acute  Fever of 100F or higher   Following upper endoscopy (EGD)  Vomiting of blood or coffee ground material  New chest pain or pain under the shoulder blades  Painful or persistently difficult swallowing  New shortness of breath  Fever of 100F or higher  Black, tarry-looking stools  For urgent or emergent issues, a gastroenterologist can be reached at any hour by calling [phone].   DIET:  We do recommend a small meal at first, but then you may proceed to your regular diet.  Drink plenty of fluids but you should avoid alcoholic beverages for 24 hours.  ACTIVITY:  You should plan to take it easy for the rest of today and you should NOT DRIVE or use heavy machinery until tomorrow (because of the sedation medicines used during the test).    FOLLOW UP: Our staff will call the number listed on your records the next business day following your procedure to check on you and address any questions or concerns that you may have regarding the information given to you following your procedure. If we do not reach you, we will leave a  message.  However, if you are feeling well and you are not experiencing any problems, there is no need to return our call.  We will assume that you have returned to your regular daily activities without incident.  If any biopsies were taken you will be contacted by phone or by letter within the next 1-3 weeks.  Please call us at [phone] if you have not heard about the biopsies in 3 weeks.    SIGNATURES/CONFIDENTIALITY: You and/or your care partner have signed paperwork which will be entered into your electronic medical record.  These signatures attest to the fact that that the information above on your After Visit Summary has been reviewed and is understood.  Full responsibility of the confidentiality of this discharge information lies with you and/or your care-partner.

## 2017-09-26 NOTE — Op Note (Signed)
[facility] Patient Name: Nancy Maddox Procedure Date: 09/26/2017 3:06 PM MRN: [id number] Endoscopist: Mauri Pole , MD Age: 71 Referring MD:  Date of Birth: [birth year] Gender: Female Account #: [id number] Procedure:                Colonoscopy Indications:              Screening for colorectal malignant neoplasm Medicines:                Monitored Anesthesia Care Procedure:                Pre-Anesthesia Assessment:                           - Prior to the procedure, a History and Physical                            was performed, and patient medications and                            allergies were reviewed. The patient's tolerance of                            previous anesthesia was also reviewed. The risks                            and benefits of the procedure and the sedation                            options and risks were discussed with the patient.                            All questions were answered, and informed consent                            was obtained. Prior Anticoagulants: The patient has                            taken no previous anticoagulant or antiplatelet                            agents. ASA Grade Assessment: II - A patient with                            mild systemic disease. After reviewing the risks                            and benefits, the patient was deemed in                            satisfactory condition to undergo the procedure.                           After obtaining informed consent, the colonoscope  was passed under direct vision. Throughout the                            procedure, the patient's blood pressure, pulse, and                            oxygen saturations were monitored continuously. The                            Colonoscope was introduced through the anus and                            advanced to the the cecum, identified by                            appendiceal orifice  and ileocecal valve. The                            colonoscopy was performed without difficulty. The                            patient tolerated the procedure well. The quality                            of the bowel preparation was adequate. The                            ileocecal valve, appendiceal orifice, and rectum                            were photographed. Scope In: 3:20:54 PM Scope Out: 3:39:56 PM Scope Withdrawal Time: 0 hours 14 minutes 16 seconds  Total Procedure Duration: 0 hours 19 minutes 2 seconds  Findings:                 The perianal and digital rectal examinations were                            normal.                           A 5 mm polyp was found in the cecum. The polyp was                            sessile. The polyp was removed with a cold snare.                            Resection and retrieval were complete.                           A 12 mm polyp was found in the ascending colon. The                            polyp was pedunculated. The polyp was removed with  a hot snare. Resection and retrieval were complete.                           Two sessile polyps were found in the descending                            colon and ascending colon. The polyps were 2 to 3                            mm in size. These polyps were removed with a cold                            biopsy forceps. Resection and retrieval were                            complete.                           Multiple small and large-mouthed diverticula were                            found in the sigmoid colon and descending colon.                            There was evidence of diverticular spasm.                           Non-bleeding internal hemorrhoids were found during                            retroflexion. The hemorrhoids were medium-sized. Complications:            No immediate complications. Estimated Blood Loss:     Estimated blood loss was  minimal. Impression:               - One 5 mm polyp in the cecum, removed with a cold                            snare. Resected and retrieved.                           - One 12 mm polyp in the ascending colon, removed                            with a hot snare. Resected and retrieved.                           - Two 2 to 3 mm polyps in the descending colon and                            in the ascending colon, removed with a cold biopsy                            forceps. Resected  and retrieved.                           - Moderate diverticulosis in the sigmoid colon and                            in the descending colon. There was evidence of                            diverticular spasm.                           - Non-bleeding internal hemorrhoids. Recommendation:           - Patient has a contact number available for                            emergencies. The signs and symptoms of potential                            delayed complications were discussed with the                            patient. Return to normal activities tomorrow.                            Written discharge instructions were provided to the                            patient.                           - Resume previous diet.                           - Continue present medications.                           - Await pathology results.                           - Repeat colonoscopy in 3 - 5 years for                            surveillance based on pathology results. Mauri Pole, MD 09/26/2017 3:47:42 PM This report has been signed electronically.

## 2017-09-26 NOTE — Progress Notes (Signed)
KN told pt that she could leave and would pass gas after she was moving around at home.  OK for pt to leave and go eat when time in Recovery was up.

## 2017-09-26 NOTE — Progress Notes (Signed)
Called to room to assist during endoscopic procedure.  Patient ID and intended procedure confirmed with present staff. Received instructions for my participation in the procedure from the performing physician.  

## 2017-09-29 ENCOUNTER — Telehealth: Payer: Self-pay

## 2017-09-29 NOTE — Telephone Encounter (Signed)
  Follow up Call-  Call back number 09/26/2017  Post procedure Call Back phone  #    [phone]  Permission to leave phone message Yes  Some recent data might be hidden     Patient questions:  Do you have a fever, pain , or abdominal swelling? No. Pain Score  0 *  Have you tolerated food without any problems? Yes.    Have you been able to return to your normal activities? Yes.    Do you have any questions about your discharge instructions: Diet   No. Medications  No. Follow up visit  No.  Do you have questions or concerns about your Care? No.  Actions: * If pain score is 4 or above: No action needed, pain <4.

## 2017-10-06 ENCOUNTER — Encounter: Payer: Self-pay | Admitting: Gastroenterology

## 2017-10-08 ENCOUNTER — Ambulatory Visit: Payer: Self-pay | Admitting: General Surgery

## 2017-10-28 NOTE — Pre-Procedure Instructions (Signed)
BATHSHEBA DURRETT  10/28/2017      [id number] [address], [place] [address] Phone: [phone] Fax: [phone]    Your procedure is scheduled on 11/03/2017.  Report to [facility] Admitting at 1100 A.M.  Call this number if you have problems the morning of surgery:  [phone]   Remember:  Do not eat food or drink liquids after midnight.   Continue all medications as directed by your physician except follow these medication instructions before surgery below   Take these medicines the morning of surgery with A SIP OF WATER: Albuterol inhaler if needed Amlodipine (Norvasc) Fluticasone (Flonase) - if needed Gabapentin (Neurontin) Omeprazole (Prilosec) Ondanestron (Zofran) Sodium Chloride (Ocean) nasal spray - if needed Tetrahydrozoline HCl (Visine) eye drops - if needed  7 days prior to surgery STOP taking any Meloxicam (Mobic), Aspirin (unless otherwise instructed by your surgeon), Aleve, Naproxen, Ibuprofen, Motrin, Advil, Goody's, BC's, all herbal medications, fish oil, and all vitamins    Do not wear jewelry, make-up or nail polish.  Do not wear lotions, powders, or perfumes, or deodorant.  Do not shave 48 hours prior to surgery.    Do not bring valuables to the hospital.  [facility] is not responsible for any belongings or valuables.  Hearing aids, eyeglasses, contacts, dentures or bridgework may not be worn into surgery.  Leave your suitcase in the car.  After surgery it may be brought to your room.  For patients admitted to the hospital, discharge time will be determined by your treatment team.  Patients discharged the day of surgery will not be allowed to drive home.   Name and phone number of your driver:    Special instructions:   [facility]- Preparing For Surgery  Before surgery, you can play an important role. Because skin is not sterile, your skin  needs to be as free of germs as possible. You can reduce the number of germs on your skin by washing with CHG (chlorahexidine gluconate) Soap before surgery.  CHG is an antiseptic cleaner which kills germs and bonds with the skin to continue killing germs even after washing.  Please do not use if you have an allergy to CHG or antibacterial soaps. If your skin becomes reddened/irritated stop using the CHG.  Do not shave (including legs and underarms) for at least 48 hours prior to first CHG shower. It is OK to shave your face.  Please follow these instructions carefully.   1. Shower the NIGHT BEFORE SURGERY and the MORNING OF SURGERY with CHG.   2. If you chose to wash your hair, wash your hair first as usual with your normal shampoo.  3. After you shampoo, rinse your hair and body thoroughly to remove the shampoo.  4. Use CHG as you would any other liquid soap. You can apply CHG directly to the skin and wash gently with a scrungie or a clean washcloth.   5. Apply the CHG Soap to your body ONLY FROM THE NECK DOWN.  Do not use on open wounds or open sores. Avoid contact with your eyes, ears, mouth and genitals (private parts). Wash Face and genitals (private parts)  with your normal soap.  6. Wash thoroughly, paying special attention to the area where your surgery will be performed.  7. Thoroughly rinse your body with warm water from the neck down.  8. DO NOT shower/wash with your normal  soap after using and rinsing off the CHG Soap.  9. Pat yourself dry with a CLEAN TOWEL.  10. Wear CLEAN PAJAMAS to bed the night before surgery, wear comfortable clothes the morning of surgery  11. Place CLEAN SHEETS on your bed the night of your first shower and DO NOT SLEEP WITH PETS.    Day of Surgery: Shower as stated above. Do not apply any deodorants/lotions. Please wear clean clothes to the hospital/surgery center.      Please read over the following fact sheets that you were  given.

## 2017-10-29 ENCOUNTER — Encounter (HOSPITAL_COMMUNITY): Payer: Self-pay

## 2017-10-29 ENCOUNTER — Encounter (HOSPITAL_COMMUNITY)
Admission: RE | Admit: 2017-10-29 | Discharge: 2017-10-29 | Disposition: A | Discharge status: Home / Self Care | Payer: Medicare HMO | Source: Ambulatory Visit | Attending: General Surgery | Admitting: General Surgery

## 2017-10-29 ENCOUNTER — Other Ambulatory Visit: Payer: Self-pay

## 2017-10-29 DIAGNOSIS — Z01818 Encounter for other preprocedural examination: Secondary | ICD-10-CM | POA: Diagnosis present

## 2017-10-29 DIAGNOSIS — K8020 Calculus of gallbladder without cholecystitis without obstruction: Secondary | ICD-10-CM | POA: Insufficient documentation

## 2017-10-29 HISTORY — DX: Headache, unspecified: R51.9

## 2017-10-29 HISTORY — DX: Unspecified osteoarthritis, unspecified site: M19.90

## 2017-10-29 HISTORY — DX: Other specified postprocedural states: R11.2

## 2017-10-29 HISTORY — DX: Other specified postprocedural states: Z98.890

## 2017-10-29 HISTORY — DX: Adverse effect of unspecified anesthetic, initial encounter: T41.45XA

## 2017-10-29 HISTORY — DX: Personal history of other diseases of the digestive system: Z87.19

## 2017-10-29 HISTORY — DX: Headache: R51

## 2017-10-29 HISTORY — DX: Other complications of anesthesia, initial encounter: T88.59XA

## 2017-10-29 HISTORY — DX: Gastro-esophageal reflux disease without esophagitis: K21.9

## 2017-10-29 HISTORY — DX: Anemia, unspecified: D64.9

## 2017-10-29 HISTORY — DX: Pneumonia, unspecified organism: J18.9

## 2017-10-29 LAB — BASIC METABOLIC PANEL
ANION GAP: 11 (ref 5–15)
BUN: 12 mg/dL (ref 6–20)
CALCIUM: 9.2 mg/dL (ref 8.9–10.3)
CO2: 26 mmol/L (ref 22–32)
CREATININE: 0.88 mg/dL (ref 0.44–1.00)
Chloride: 102 mmol/L (ref 101–111)
GFR calc Af Amer: >60 mL/min (ref >60)
Glucose, Bld: 87 mg/dL (ref 65–99)
Potassium: 3.4 mmol/L — ABNORMAL LOW (ref 3.5–5.1)
Sodium: 139 mmol/L (ref 135–145)

## 2017-10-29 LAB — CBC
HCT: 40.8 % (ref 36.0–46.0)
HEMOGLOBIN: 13.2 g/dL (ref 12.0–15.0)
MCH: 29.6 pg (ref 26.0–34.0)
MCHC: 32.4 g/dL (ref 30.0–36.0)
MCV: 91.5 fL (ref 78.0–100.0)
Platelets: 254 10*3/uL (ref 150–400)
RBC: 4.46 MIL/uL (ref 3.87–5.11)
RDW: 14.4 % (ref 11.5–15.5)
WBC: 5 10*3/uL (ref 4.0–10.5)

## 2017-10-29 NOTE — Progress Notes (Signed)
PCP - Dr. Maia Petties Cardiologist - patient denies  Chest x-ray - n/a EKG - 10/29/2017 Stress Test - patient denies ECHO - patient denies Cardiac Cath - patient denies  Sleep Study - patient denies   Anesthesia review: yes, hx of anesthesia complication.  Patient stated in 1978 when she had her son via c-section, she was given "too much anesthesia, too quickly" and could not see for 48 hours after.  Patient stated this was also when she had pulmonary edema and the reason she had to have a c-section  Patient denies shortness of breath, fever, cough and chest pain at PAT appointment   Patient verbalized understanding of instructions that were given to them at the PAT appointment. Patient was also instructed that they will need to review over the PAT instructions again at home before surgery.

## 2017-10-30 ENCOUNTER — Encounter (HOSPITAL_COMMUNITY): Payer: Self-pay
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
## 2017-11-03 ENCOUNTER — Ambulatory Visit (HOSPITAL_COMMUNITY): Payer: Medicare HMO | Admitting: Vascular Surgery

## 2017-11-03 ENCOUNTER — Ambulatory Visit (HOSPITAL_COMMUNITY)
Admission: RE | Admit: 2017-11-03 | Discharge: 2017-11-03 | Disposition: A | Discharge status: Home / Self Care | Payer: Medicare HMO | Source: Ambulatory Visit | Attending: General Surgery | Admitting: General Surgery

## 2017-11-03 ENCOUNTER — Encounter (HOSPITAL_COMMUNITY): Payer: Self-pay | Admitting: *Deleted

## 2017-11-03 ENCOUNTER — Encounter (HOSPITAL_COMMUNITY)
Admission: RE | Disposition: A | Discharge status: Home / Self Care | Payer: Self-pay | Source: Ambulatory Visit | Attending: General Surgery

## 2017-11-03 ENCOUNTER — Ambulatory Visit (HOSPITAL_COMMUNITY): Payer: Medicare HMO | Admitting: Certified Registered Nurse Anesthetist

## 2017-11-03 DIAGNOSIS — Z8249 Family history of ischemic heart disease and other diseases of the circulatory system: Secondary | ICD-10-CM | POA: Diagnosis not present

## 2017-11-03 DIAGNOSIS — Z6832 Body mass index (BMI) 32.0-32.9, adult: Secondary | ICD-10-CM | POA: Diagnosis not present

## 2017-11-03 DIAGNOSIS — K219 Gastro-esophageal reflux disease without esophagitis: Secondary | ICD-10-CM | POA: Insufficient documentation

## 2017-11-03 DIAGNOSIS — Z79899 Other long term (current) drug therapy: Secondary | ICD-10-CM | POA: Insufficient documentation

## 2017-11-03 DIAGNOSIS — I10 Essential (primary) hypertension: Secondary | ICD-10-CM | POA: Insufficient documentation

## 2017-11-03 DIAGNOSIS — M1990 Unspecified osteoarthritis, unspecified site: Secondary | ICD-10-CM | POA: Diagnosis not present

## 2017-11-03 DIAGNOSIS — K8020 Calculus of gallbladder without cholecystitis without obstruction: Secondary | ICD-10-CM | POA: Diagnosis present

## 2017-11-03 DIAGNOSIS — K8010 Calculus of gallbladder with chronic cholecystitis without obstruction: Secondary | ICD-10-CM | POA: Insufficient documentation

## 2017-11-03 DIAGNOSIS — E669 Obesity, unspecified: Secondary | ICD-10-CM | POA: Diagnosis not present

## 2017-11-03 HISTORY — PX: CHOLECYSTECTOMY: SHX55

## 2017-11-03 SURGERY — LAPAROSCOPIC CHOLECYSTECTOMY WITH INTRAOPERATIVE CHOLANGIOGRAM
Anesthesia: General

## 2017-11-03 MED ORDER — MIDAZOLAM HCL 2 MG/2ML IJ SOLN
INTRAMUSCULAR | Status: AC
  Filled 2017-11-03: qty 2

## 2017-11-03 MED ORDER — SUGAMMADEX SODIUM 200 MG/2ML IV SOLN
INTRAVENOUS | Status: AC
  Filled 2017-11-03: qty 2

## 2017-11-03 MED ORDER — PHENYLEPHRINE HCL 10 MG/ML IJ SOLN
INTRAMUSCULAR | Status: DC | PRN
  Administered 2017-11-03: 100 ug via INTRAVENOUS
  Administered 2017-11-03: 40 ug via INTRAVENOUS
  Administered 2017-11-03: 100 ug via INTRAVENOUS

## 2017-11-03 MED ORDER — BUPIVACAINE-EPINEPHRINE 0.5% -1:200000 IJ SOLN
INTRAMUSCULAR | Status: DC | PRN
  Administered 2017-11-03: 20 mL

## 2017-11-03 MED ORDER — HYDROMORPHONE HCL 1 MG/ML IJ SOLN
INTRAMUSCULAR | Status: AC
  Administered 2017-11-03: 0.5 mg via INTRAVENOUS
  Filled 2017-11-03: qty 1

## 2017-11-03 MED ORDER — ROCURONIUM BROMIDE 100 MG/10ML IV SOLN
INTRAVENOUS | Status: DC | PRN
  Administered 2017-11-03: 20 mg via INTRAVENOUS

## 2017-11-03 MED ORDER — FENTANYL CITRATE (PF) 100 MCG/2ML IJ SOLN
INTRAMUSCULAR | Status: DC | PRN
  Administered 2017-11-03: 100 ug via INTRAVENOUS
  Administered 2017-11-03 (×2): 25 ug via INTRAVENOUS
  Administered 2017-11-03: 50 ug via INTRAVENOUS

## 2017-11-03 MED ORDER — ONDANSETRON HCL 4 MG/2ML IJ SOLN
4.0000 mg | Freq: Once | INTRAMUSCULAR | Status: AC | PRN
  Administered 2017-11-03: 4 mg via INTRAVENOUS

## 2017-11-03 MED ORDER — PHENYLEPHRINE 40 MCG/ML (10ML) SYRINGE FOR IV PUSH (FOR BLOOD PRESSURE SUPPORT)
PREFILLED_SYRINGE | INTRAVENOUS | Status: AC
  Filled 2017-11-03: qty 10

## 2017-11-03 MED ORDER — BUPIVACAINE-EPINEPHRINE (PF) 0.5% -1:200000 IJ SOLN
INTRAMUSCULAR | Status: AC
  Filled 2017-11-03: qty 30

## 2017-11-03 MED ORDER — ONDANSETRON HCL 4 MG/2ML IJ SOLN
INTRAMUSCULAR | Status: AC
  Filled 2017-11-03: qty 2

## 2017-11-03 MED ORDER — SUGAMMADEX SODIUM 200 MG/2ML IV SOLN
INTRAVENOUS | Status: DC | PRN
  Administered 2017-11-03: 160 mg via INTRAVENOUS

## 2017-11-03 MED ORDER — PROPOFOL 10 MG/ML IV BOLUS
INTRAVENOUS | Status: DC | PRN
  Administered 2017-11-03: 20 mg via INTRAVENOUS
  Administered 2017-11-03: 100 mg via INTRAVENOUS

## 2017-11-03 MED ORDER — SUCCINYLCHOLINE CHLORIDE 20 MG/ML IJ SOLN
INTRAMUSCULAR | Status: DC | PRN
  Administered 2017-11-03: 140 mg via INTRAVENOUS

## 2017-11-03 MED ORDER — FENTANYL CITRATE (PF) 250 MCG/5ML IJ SOLN
INTRAMUSCULAR | Status: AC
  Filled 2017-11-03: qty 5

## 2017-11-03 MED ORDER — CHLORHEXIDINE GLUCONATE CLOTH 2 % EX PADS: 6.0000 | MEDICATED_PAD | Freq: Once | CUTANEOUS | Status: DC

## 2017-11-03 MED ORDER — SUCCINYLCHOLINE CHLORIDE 200 MG/10ML IV SOSY
PREFILLED_SYRINGE | INTRAVENOUS | Status: AC
  Filled 2017-11-03: qty 10

## 2017-11-03 MED ORDER — MIDAZOLAM HCL 5 MG/5ML IJ SOLN
INTRAMUSCULAR | Status: DC | PRN
  Administered 2017-11-03: 2 mg via INTRAVENOUS

## 2017-11-03 MED ORDER — CEFAZOLIN SODIUM-DEXTROSE 2-4 GM/100ML-% IV SOLN
2.0000 g | INTRAVENOUS | Status: AC
  Administered 2017-11-03: 2 g via INTRAVENOUS

## 2017-11-03 MED ORDER — 0.9 % SODIUM CHLORIDE (POUR BTL) OPTIME
TOPICAL | Status: DC | PRN
  Administered 2017-11-03: 1000 mL

## 2017-11-03 MED ORDER — CEFAZOLIN SODIUM-DEXTROSE 2-4 GM/100ML-% IV SOLN
INTRAVENOUS | Status: AC
  Filled 2017-11-03: qty 100

## 2017-11-03 MED ORDER — DEXAMETHASONE SODIUM PHOSPHATE 10 MG/ML IJ SOLN
INTRAMUSCULAR | Status: DC | PRN
  Administered 2017-11-03: 10 mg via INTRAVENOUS

## 2017-11-03 MED ORDER — SODIUM CHLORIDE 0.9 % IR SOLN
Status: DC | PRN
  Administered 2017-11-03: 1000 mL

## 2017-11-03 MED ORDER — LIDOCAINE HCL (CARDIAC) 20 MG/ML IV SOLN
INTRAVENOUS | Status: DC | PRN
  Administered 2017-11-03: 100 mg via INTRAVENOUS

## 2017-11-03 MED ORDER — ROCURONIUM BROMIDE 10 MG/ML (PF) SYRINGE
PREFILLED_SYRINGE | INTRAVENOUS | Status: AC
  Filled 2017-11-03: qty 5

## 2017-11-03 MED ORDER — LACTATED RINGERS IV SOLN
INTRAVENOUS | Status: DC
  Administered 2017-11-03 (×2): via INTRAVENOUS

## 2017-11-03 MED ORDER — DEXAMETHASONE SODIUM PHOSPHATE 10 MG/ML IJ SOLN
INTRAMUSCULAR | Status: AC
  Filled 2017-11-03: qty 1

## 2017-11-03 MED ORDER — OXYCODONE HCL 5 MG PO TABS: 5.0000 mg | ORAL_TABLET | Freq: Four times a day (QID) | ORAL | 0 refills | Status: DC | PRN

## 2017-11-03 MED ORDER — MEPERIDINE HCL 25 MG/ML IJ SOLN: 6.2500 mg | INTRAMUSCULAR | Status: DC | PRN

## 2017-11-03 MED ORDER — HYDROMORPHONE HCL 1 MG/ML IJ SOLN
0.2500 mg | INTRAMUSCULAR | Status: DC | PRN
  Administered 2017-11-03 (×2): 0.5 mg via INTRAVENOUS

## 2017-11-03 MED ORDER — IOPAMIDOL (ISOVUE-300) INJECTION 61%
INTRAVENOUS | Status: AC
  Filled 2017-11-03: qty 50

## 2017-11-03 MED ORDER — LIDOCAINE 2% (20 MG/ML) 5 ML SYRINGE
INTRAMUSCULAR | Status: AC
  Filled 2017-11-03: qty 5

## 2017-11-03 MED ORDER — ONDANSETRON HCL 4 MG/2ML IJ SOLN
INTRAMUSCULAR | Status: DC | PRN
  Administered 2017-11-03: 4 mg via INTRAVENOUS

## 2017-11-03 SURGICAL SUPPLY — 51 items
ADH SKN CLS APL DERMABOND .7 (GAUZE/BANDAGES/DRESSINGS) ×1
ADH SKN CLS LQ APL DERMABOND (GAUZE/BANDAGES/DRESSINGS) ×1
APPLIER CLIP 5 13 M/L LIGAMAX5 (MISCELLANEOUS) ×2
APR CLP MED LRG 5 ANG JAW (MISCELLANEOUS) ×1
BAG SPEC RTRVL 10 TROC 200 (ENDOMECHANICALS) ×1
BLADE CLIPPER SURG (BLADE) IMPLANT
CANISTER SUCT 3000ML PPV (MISCELLANEOUS) ×2 IMPLANT
CHLORAPREP W/TINT 26ML (MISCELLANEOUS) ×2 IMPLANT
CLIP APPLIE 5 13 M/L LIGAMAX5 (MISCELLANEOUS) ×1 IMPLANT
COVER MAYO STAND STRL (DRAPES) ×2 IMPLANT
COVER SURGICAL LIGHT HANDLE (MISCELLANEOUS) ×2 IMPLANT
DERMABOND ADHESIVE PROPEN (GAUZE/BANDAGES/DRESSINGS) ×1
DERMABOND ADVANCED (GAUZE/BANDAGES/DRESSINGS) ×1
DERMABOND ADVANCED .7 DNX12 (GAUZE/BANDAGES/DRESSINGS) ×1 IMPLANT
DERMABOND ADVANCED .7 DNX6 (GAUZE/BANDAGES/DRESSINGS) IMPLANT
DRAPE C-ARM 42X72 X-RAY (DRAPES) ×2 IMPLANT
ELECT REM PT RETURN 9FT ADLT (ELECTROSURGICAL) ×2
ELECTRODE REM PT RTRN 9FT ADLT (ELECTROSURGICAL) ×1 IMPLANT
FILTER SMOKE EVAC LAPAROSHD (FILTER) IMPLANT
GLOVE BIO SURGEON STRL SZ8 (GLOVE) ×2 IMPLANT
GLOVE BIOGEL PI IND STRL 7.5 (GLOVE) IMPLANT
GLOVE BIOGEL PI IND STRL 8 (GLOVE) ×1 IMPLANT
GLOVE BIOGEL PI INDICATOR 7.5 (GLOVE) ×1
GLOVE BIOGEL PI INDICATOR 8 (GLOVE) ×1
GOWN STRL REUS W/ TWL LRG LVL3 (GOWN DISPOSABLE) ×2 IMPLANT
GOWN STRL REUS W/ TWL XL LVL3 (GOWN DISPOSABLE) ×1 IMPLANT
GOWN STRL REUS W/TWL LRG LVL3 (GOWN DISPOSABLE) ×4
GOWN STRL REUS W/TWL XL LVL3 (GOWN DISPOSABLE) ×2
KIT BASIN OR (CUSTOM PROCEDURE TRAY) ×2 IMPLANT
KIT ROOM TURNOVER OR (KITS) ×2 IMPLANT
L-HOOK LAP DISP 36CM (ELECTROSURGICAL) ×2
LHOOK LAP DISP 36CM (ELECTROSURGICAL) ×1 IMPLANT
NEEDLE 22X1 1/2 (OR ONLY) (NEEDLE) ×2 IMPLANT
NS IRRIG 1000ML POUR BTL (IV SOLUTION) ×2 IMPLANT
PAD ARMBOARD 7.5X6 YLW CONV (MISCELLANEOUS) ×2 IMPLANT
PENCIL BUTTON HOLSTER BLD 10FT (ELECTRODE) ×2 IMPLANT
POUCH RETRIEVAL ECOSAC 10 (ENDOMECHANICALS) ×1 IMPLANT
POUCH RETRIEVAL ECOSAC 10MM (ENDOMECHANICALS) ×1
SCISSORS LAP 5X35 DISP (ENDOMECHANICALS) ×2 IMPLANT
SET CHOLANGIOGRAPH 5 50 .035 (SET/KITS/TRAYS/PACK) ×2 IMPLANT
SET IRRIG TUBING LAPAROSCOPIC (IRRIGATION / IRRIGATOR) ×2 IMPLANT
SLEEVE ENDOPATH XCEL 5M (ENDOMECHANICALS) ×4 IMPLANT
SPECIMEN JAR SMALL (MISCELLANEOUS) ×2 IMPLANT
SUT VIC AB 4-0 PS2 27 (SUTURE) ×2 IMPLANT
TOWEL OR 17X24 6PK STRL BLUE (TOWEL DISPOSABLE) ×2 IMPLANT
TOWEL OR 17X26 10 PK STRL BLUE (TOWEL DISPOSABLE) ×2 IMPLANT
TRAY LAPAROSCOPIC MC (CUSTOM PROCEDURE TRAY) ×2 IMPLANT
TROCAR XCEL BLUNT TIP 100MML (ENDOMECHANICALS) ×2 IMPLANT
TROCAR XCEL NON-BLD 5MMX100MML (ENDOMECHANICALS) ×2 IMPLANT
TUBING INSUFFLATION (TUBING) ×2 IMPLANT
WATER STERILE IRR 1000ML POUR (IV SOLUTION) ×2 IMPLANT

## 2017-11-03 NOTE — Transfer of Care (Signed)
Immediate Anesthesia Transfer of Care Note  Patient: OMESHA BOWERMAN  Procedure(s) Performed: LAPAROSCOPIC CHOLECYSTECTOMY WITH ATTEMPTED INTRAOPERATIVE CHOLANGIOGRAM (N/A )  Patient Location: PACU  Anesthesia Type:General  Level of Consciousness: awake, alert , oriented and patient cooperative  Airway & Oxygen Therapy: Patient Spontanous Breathing and Patient connected to nasal cannula oxygen  Post-op Assessment: Report given to RN and Post -op Vital signs reviewed and stable  Post vital signs: Reviewed and stable  Last Vitals:  Vitals:   11/03/17 1118 11/03/17 1402  BP: 139/65 (!) (P) 150/79  Pulse: 81 (P) 84  Resp: 18 (P) 19  Temp: 37 C 36.8 C  SpO2: 99% (P) 99%    Last Pain:  Vitals:   11/03/17 1211  TempSrc:   PainSc: 8       Patients Stated Pain Goal: 6 (44/01/02 7253)  Complications: No apparent anesthesia complications

## 2017-11-03 NOTE — Anesthesia Postprocedure Evaluation (Signed)
Anesthesia Post Note  Patient: Nancy Maddox  Procedure(s) Performed: LAPAROSCOPIC CHOLECYSTECTOMY WITH ATTEMPTED INTRAOPERATIVE CHOLANGIOGRAM (N/A )     Patient location during evaluation: PACU Anesthesia Type: General Level of consciousness: awake and alert Pain management: pain level controlled Vital Signs Assessment: post-procedure vital signs reviewed and stable Respiratory status: spontaneous breathing, nonlabored ventilation, respiratory function stable and patient connected to nasal cannula oxygen Cardiovascular status: blood pressure returned to baseline and stable Postop Assessment: no apparent nausea or vomiting Anesthetic complications: no    Last Vitals:  Vitals:   11/03/17 1515 11/03/17 1525  BP: 116/66 (!) 141/69  Pulse: 77 76  Resp: 12 13  Temp:  36.4 C  SpO2: 93% 96%    Last Pain:  Vitals:   11/03/17 1500  TempSrc:   PainSc: [place] DAVID

## 2017-11-03 NOTE — Op Note (Signed)
11/03/2017  1:51 PM  PATIENT:  Nancy Maddox  72 y.o. female  PRE-OPERATIVE DIAGNOSIS:  symptomatic cholelithiasis  POST-OPERATIVE DIAGNOSIS:  symptomatic cholelithiasis  PROCEDURE:  Procedure(s): LAPAROSCOPIC CHOLECYSTECTOMY  SURGEON:  Surgeon(s): Georganna Skeans, MD  ASSISTANTS: Sharyn Dross, RNFA   ANESTHESIA:   local and general  EBL:  Total I/O In: 1000 [I.V.:1000] Out: -   BLOOD ADMINISTERED:none  DRAINS: none   SPECIMEN:  Excision  DISPOSITION OF SPECIMEN:  PATHOLOGY  COUNTS:  YES  DICTATION: .Dragon Dictation Findings: Multiple gallstones, unable to do cholangiogram due to multiple valves in cystic duct  Procedure in detail: [name] presents for laparoscopic cholecystectomy.  Nancy Maddox was identified in the preop holding area.  Informed consent was obtained.  Nancy Maddox received intravenous antibiotics.  Nancy Maddox was brought to the operating room and general endotracheal anesthesia was administered by the anesthesia staff.  Her abdomen was prepped and draped in a sterile fashion.  We did a timeout procedure.The infraumbilical region was infiltrated with local. Infraumbilical incision was made. Subcutaneous tissues were dissected down revealing the anterior fascia. This was divided sharply along the midline. Peritoneal cavity was entered under direct vision without complication. A 0 Vicryl pursestring was placed around the fascial opening. Hassan trocar was inserted into the abdomen. The abdomen was insufflated with carbon dioxide in standard fashion. Under direct vision a 5 mm epigastric and 2 5 mm right abdominal ports were placed.  Local was used at each port site.  The dome of the gallbladder was retracted superior medially.  The gallbladder was noted to be full of stones.  The infundibulum was retracted inferior laterally.  Dissection began laterally and progressed medially easily identifying the cystic duct.  Dissection continued until a critical view was obtained between the cystic  duct, the gallbladder, and the liver.  I was also able to see the cystic duct common duct junction.  A clip was placed proximally on the cystic duct and a small nick was made in the cystic duct.  After multiple attempts, I was unable to get a cholangiogram to slide down the cystic duct because it had many valves.  I was unable to perform the cholangiogram.  3 clips were placed proximally on the cystic duct and it was divided.  The cystic artery was then dissected out.  This was clipped twice proximally and divided distally with cautery.  There is an additional small vessel that was clipped once proximally.  The gallbladder was taken off the liver bed using cautery.  It was placed in a bag and removed from the abdomen.  The liver bed was cauterized to get excellent hemostasis.  The area was copiously irrigated and the irrigation fluid returned clear.  The liver bed was dry and clips remain in good position.  Ports were removed under direct vision.  Pneumoperitoneum was released.  Infraumbilical fascia was closed by tying the pursestring.  The wounds were irrigated and each was closed with running 4-0 Vicryl followed by Dermabond.  All counts were correct.  Nancy Maddox tolerated the procedure well.  There were no apparent complications and Nancy Maddox was taken recovery in stable condition.  PATIENT DISPOSITION:  PACU - hemodynamically stable.   Delay start of Pharmacological VTE agent (>24hrs) due to surgical blood loss or risk of bleeding:  no  Georganna Skeans, MD, MPH, FACS Pager: [phone]  2/4/20191:51 PM

## 2017-11-03 NOTE — Anesthesia Procedure Notes (Signed)
Procedure Name: Intubation Date/Time: 11/03/2017 1:05 PM Performed by: Raenette Rover, CRNA Pre-anesthesia Checklist: Patient identified, Emergency Drugs available, Suction available and Patient being monitored Patient Re-evaluated:Patient Re-evaluated prior to induction Oxygen Delivery Method: Circle system utilized Preoxygenation: Pre-oxygenation with 100% oxygen Induction Type: IV induction and Rapid sequence Laryngoscope Size: Mac and 3 Grade View: Grade I Tube type: Oral Tube size: 7.0 mm Number of attempts: 1 Airway Equipment and Method: Stylet Placement Confirmation: ETT inserted through vocal cords under direct vision,  positive ETCO2,  CO2 detector and breath sounds checked- equal and bilateral Secured at: 21 cm Tube secured with: Tape Dental Injury: Teeth and Oropharynx as per pre-operative assessment

## 2017-11-03 NOTE — Anesthesia Preprocedure Evaluation (Addendum)
Anesthesia Evaluation  Patient identified by MRN, date of birth, ID band Patient awake    Reviewed: Allergy & Precautions, NPO status , Patient's Chart, lab work & pertinent test results  History of Anesthesia Complications (+) PONV  Airway Mallampati: II  TM Distance: >3 FB Neck ROM: Full    Dental  (+) Missing, Dental Advisory Given, Partial Upper   Pulmonary    Pulmonary exam normal breath sounds clear to auscultation       Cardiovascular hypertension, Pt. on medications Normal cardiovascular exam Rhythm:Regular Rate:Normal     Neuro/Psych    GI/Hepatic GERD  Medicated and Controlled,  Endo/Other    Renal/GU      Musculoskeletal   Abdominal (+) + obese,   Peds  Hematology   Anesthesia Other Findings   Reproductive/Obstetrics                            Anesthesia Physical Anesthesia Plan  ASA: III  Anesthesia Plan: General   Post-op Pain Management:    Induction: Rapid sequence, Intravenous and Cricoid pressure planned  PONV Risk Score and Plan: 4 or greater and Ondansetron, Dexamethasone and Treatment may vary due to age or medical condition  Airway Management Planned: Oral ETT  Additional Equipment:   Intra-op Plan:   Post-operative Plan: Extubation in OR  Informed Consent: I have reviewed the patients History and Physical, chart, labs and discussed the procedure including the risks, benefits and alternatives for the proposed anesthesia with the patient or authorized representative who has indicated his/her understanding and acceptance.   Dental advisory given  Plan Discussed with: CRNA and Surgeon  Anesthesia Plan Comments:        Anesthesia Quick Evaluation

## 2017-11-03 NOTE — H&P (Signed)
Nancy Maddox is an 72 y.o. female.   Chief Complaint: RUQ pain HPI: Nancy Maddox presents for laparoscopic cholecystectomy. She is having episodic RUQ pain.  Past Medical History:  Diagnosis Date  . Anemia   . Arthritis   . Complication of anesthesia    patient states that she was given too much anesthesia, too quickly prior to a C-section and could not see for 48 hours  . GERD (gastroesophageal reflux disease)   . Headache   . History of hiatal hernia   . Hypertension   . Pneumonia   . PONV (postoperative nausea and vomiting)   . Pulmonary edema    in setting of pregnancy '78    Past Surgical History:  Procedure Laterality Date  . CARPAL TUNNEL RELEASE  05/2017  . CESAREAN SECTION    . COLONOSCOPY WITH ESOPHAGOGASTRODUODENOSCOPY (EGD)    . FEMUR FRACTURE SURGERY    . UTERINE FIBROID SURGERY      Family History  Problem Relation Age of Onset  . Asthma Son   . CAD Sister   . Diabetes Sister   . Breast cancer Maternal Aunt   . Hypertension Mother   . Kidney disease Mother   . Heart attack Father   . Colon cancer Cousin    Social History:  reports that she is a non-smoker but has been exposed to tobacco smoke. she has never used smokeless tobacco. She reports that she does not drink alcohol or use drugs.  Allergies: No Known Allergies  Facility-Administered Medications Prior to Admission  Medication Dose Route Frequency Provider Last Rate Last Dose  . 0.9 %  sodium chloride infusion  500 mL Intravenous Once Nandigam, Venia Minks, MD       Medications Prior to Admission  Medication Sig Dispense Refill  . albuterol (VENTOLIN HFA) 108 (90 Base) MCG/ACT inhaler Inhale 2 puffs into the lungs every 6 hours as needed for shortness of breath    . amLODipine (NORVASC) 10 MG tablet Take 10 mg by mouth daily.     . Calcium Carbonate-Vitamin D (CALTRATE 600+D PO) Take 1 tablet by mouth daily.     . fluticasone (FLONASE) 50 MCG/ACT nasal spray Place 1 spray into both nostrils daily as  needed for allergies.     Marland Kitchen gabapentin (NEURONTIN) 300 MG capsule Take 300 mg by mouth 2 (two) times daily.    . hydrochlorothiazide (HYDRODIURIL) 25 MG tablet Take 25 mg by mouth daily.    Marland Kitchen ibuprofen (ADVIL,MOTRIN) 200 MG tablet Take 200 mg by mouth daily as needed for headache or moderate pain.    . Liniments (SALONPAS PAIN RELIEF PATCH EX) Apply 1 patch topically 2 (two) times daily as needed (pain).    Marland Kitchen losartan (COZAAR) 100 MG tablet Take 100 mg by mouth daily.    . meloxicam (MOBIC) 15 MG tablet Take 15 mg by mouth daily.    . Multiple Vitamin (MULTIVITAMIN WITH MINERALS) TABS tablet Take 1 tablet by mouth daily.    Marland Kitchen omeprazole (PRILOSEC) 20 MG capsule Take 1 capsule (20 mg total) by mouth daily. 30 capsule 0  . ondansetron (ZOFRAN-ODT) 4 MG disintegrating tablet Take 1 tablet (4 mg total) by mouth 3 (three) times daily. 90 tablet 0  . pantoprazole (PROTONIX) 40 MG tablet Take 40 mg by mouth daily.    . potassium chloride (K-DUR,KLOR-CON) 10 MEQ tablet Take 10 mEq by mouth daily.    . sodium chloride (OCEAN) 0.65 % SOLN nasal spray Place 1 spray into both  nostrils daily as needed for congestion.    . Tetrahydrozoline HCl (VISINE OP) Apply 1 drop to eye daily as needed (allergies).    . cyclobenzaprine (FLEXERIL) 5 MG tablet Take 1 tablet (5 mg total) by mouth 2 (two) times daily as needed for muscle spasms (pain). (Patient not taking: Reported on 10/28/2017) 15 tablet 0    No results found for this or any previous visit (from the past 48 hour(s)). No results found.  Review of Systems  Unable to perform ROS: Other    Blood pressure 139/65, pulse 81, temperature 98.6 F (37 C), temperature source Oral, resp. rate 18, SpO2 99 %. Physical Exam  Constitutional: She is oriented to person, place, and time. She appears well-developed and well-nourished. No distress.  HENT:  Head: Normocephalic.  Right Ear: External ear normal.  Left Ear: External ear normal.  Mouth/Throat: Oropharynx  is clear and moist.  Eyes: EOM are normal. Pupils are equal, round, and reactive to light.  Neck: No thyromegaly present.  Cardiovascular: Normal rate and regular rhythm.  Respiratory: Effort normal and breath sounds normal. No respiratory distress. She has no wheezes.  GI: Soft. She exhibits no distension. There is no tenderness. There is no rebound.  Musculoskeletal: Normal range of motion.  Neurological: She is alert and oriented to person, place, and time.     Assessment/Plan Symptomatic cholelithiasis - for laparoscopic cholecystectomy. Procedure, risks, and benefits reviewed. She agrees. Ancef IV.  [name], MD 11/03/2017, 12:18 PM

## 2017-11-03 NOTE — Interval H&P Note (Signed)
History and Physical Interval Note:  11/03/2017 12:20 PM  Nancy Maddox  has presented today for surgery, with the diagnosis of symptomatic cholelithiasis  The various methods of treatment have been discussed with the patient and family. After consideration of risks, benefits and other options for treatment, the patient has consented to  Procedure(s): LAPAROSCOPIC CHOLECYSTECTOMY WITH INTRAOPERATIVE CHOLANGIOGRAM (N/A) as a surgical intervention .  The patient's history has been reviewed, patient examined, no change in status, stable for surgery.  I have reviewed the patient's chart and labs.  Questions were answered to the patient's satisfaction.     [name]

## 2017-11-04 ENCOUNTER — Encounter (HOSPITAL_COMMUNITY): Payer: Self-pay | Admitting: General Surgery

## 2018-01-06 ENCOUNTER — Other Ambulatory Visit: Payer: Self-pay | Admitting: Internal Medicine

## 2018-01-06 DIAGNOSIS — Z1231 Encounter for screening mammogram for malignant neoplasm of breast: Secondary | ICD-10-CM

## 2018-02-04 ENCOUNTER — Ambulatory Visit
Admission: RE | Admit: 2018-02-04 | Discharge: 2018-02-04 | Disposition: A | Discharge status: Home / Self Care | Payer: Medicare HMO | Source: Ambulatory Visit | Attending: Internal Medicine | Admitting: Internal Medicine

## 2018-02-04 DIAGNOSIS — Z1231 Encounter for screening mammogram for malignant neoplasm of breast: Secondary | ICD-10-CM

## 2018-02-04 DIAGNOSIS — E2839 Other primary ovarian failure: Secondary | ICD-10-CM

## 2018-09-08 ENCOUNTER — Encounter (INDEPENDENT_AMBULATORY_CARE_PROVIDER_SITE_OTHER): Payer: Medicare HMO | Admitting: Ophthalmology

## 2018-09-21 ENCOUNTER — Encounter (INDEPENDENT_AMBULATORY_CARE_PROVIDER_SITE_OTHER): Payer: Medicare HMO | Admitting: Ophthalmology
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
## 2018-10-14 ENCOUNTER — Ambulatory Visit (INDEPENDENT_AMBULATORY_CARE_PROVIDER_SITE_OTHER): Payer: Medicare HMO | Admitting: Ophthalmology

## 2018-10-14 ENCOUNTER — Encounter (INDEPENDENT_AMBULATORY_CARE_PROVIDER_SITE_OTHER): Payer: Self-pay | Admitting: Ophthalmology

## 2018-10-14 DIAGNOSIS — H35033 Hypertensive retinopathy, bilateral: Secondary | ICD-10-CM

## 2018-10-14 DIAGNOSIS — H3581 Retinal edema: Secondary | ICD-10-CM

## 2018-10-14 DIAGNOSIS — E119 Type 2 diabetes mellitus without complications: Secondary | ICD-10-CM | POA: Diagnosis not present

## 2018-10-14 DIAGNOSIS — H25813 Combined forms of age-related cataract, bilateral: Secondary | ICD-10-CM

## 2018-10-14 DIAGNOSIS — H31012 Macula scars of posterior pole (postinflammatory) (post-traumatic), left eye: Secondary | ICD-10-CM | POA: Diagnosis not present

## 2018-10-14 DIAGNOSIS — I10 Essential (primary) hypertension: Secondary | ICD-10-CM | POA: Diagnosis not present

## 2019-02-15 ENCOUNTER — Encounter (INDEPENDENT_AMBULATORY_CARE_PROVIDER_SITE_OTHER): Payer: Medicare HMO | Admitting: Ophthalmology

## 2019-03-29 ENCOUNTER — Other Ambulatory Visit: Payer: Self-pay | Admitting: Internal Medicine

## 2019-03-29 DIAGNOSIS — Z1231 Encounter for screening mammogram for malignant neoplasm of breast: Secondary | ICD-10-CM

## 2019-12-27 ENCOUNTER — Telehealth: Payer: Self-pay

## 2020-01-11 ENCOUNTER — Other Ambulatory Visit: Payer: Self-pay

## 2020-01-12 ENCOUNTER — Encounter: Payer: Self-pay | Admitting: Cardiology

## 2020-01-12 ENCOUNTER — Ambulatory Visit: Payer: Medicare HMO | Admitting: Cardiology

## 2020-01-12 ENCOUNTER — Other Ambulatory Visit: Payer: Self-pay

## 2020-01-12 VITALS — BP 136/79 | HR 85 | Temp 97.5°F | Resp 18 | Ht 64.0 in | Wt 154.0 lb

## 2020-01-12 DIAGNOSIS — I10 Essential (primary) hypertension: Secondary | ICD-10-CM

## 2020-01-12 DIAGNOSIS — R7303 Prediabetes: Secondary | ICD-10-CM

## 2020-01-12 DIAGNOSIS — I209 Angina pectoris, unspecified: Secondary | ICD-10-CM

## 2020-01-12 MED ORDER — METOPROLOL SUCCINATE ER 25 MG PO TB24: 25.0000 mg | ORAL_TABLET | Freq: Every day | ORAL | 0 refills | Status: DC
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
## 2020-01-12 NOTE — Patient Instructions (Signed)
Please remember to bring in your medication bottles in at the next visit.   New Medications that were added at today's visit:   Metoprolol succinate 25 mg p.o. daily.Hold if systolic blood pressure (top blood pressure number) less than 100 mmHg or heart rate less than 60 bpm (pulse).  Medications that were discontinued at today's visit: None  Office will call you to have the following tests scheduled:  Echo and stress test   Recommend follow up with your PCP as scheduled.

## 2020-01-14 ENCOUNTER — Ambulatory Visit: Payer: Medicare HMO

## 2020-01-14 ENCOUNTER — Other Ambulatory Visit: Payer: Self-pay

## 2020-01-14 DIAGNOSIS — I209 Angina pectoris, unspecified: Secondary | ICD-10-CM

## 2020-01-17 ENCOUNTER — Telehealth: Payer: Self-pay

## 2020-01-17 ENCOUNTER — Other Ambulatory Visit: Payer: Medicare HMO

## 2020-01-17 NOTE — Telephone Encounter (Signed)
-----   Message from [place], [place] sent at 01/16/2020 10:40 PM EDT ----- The left ventricular ejection fraction or pumping activity of the heart is within the normal limit. Details will be reviewed at the next office visit.

## 2020-01-17 NOTE — Telephone Encounter (Signed)
Left vm to cb.

## 2020-01-21 ENCOUNTER — Other Ambulatory Visit: Payer: Self-pay

## 2020-01-21 ENCOUNTER — Ambulatory Visit: Payer: Medicare HMO

## 2020-01-21 DIAGNOSIS — I209 Angina pectoris, unspecified: Secondary | ICD-10-CM

## 2020-01-27 ENCOUNTER — Other Ambulatory Visit: Payer: Self-pay

## 2020-01-27 ENCOUNTER — Ambulatory Visit: Payer: Medicare HMO | Admitting: Cardiology

## 2020-01-27 ENCOUNTER — Encounter: Payer: Self-pay | Admitting: Cardiology

## 2020-01-27 VITALS — BP 140/70 | HR 79 | Ht 64.0 in | Wt 155.0 lb

## 2020-01-27 DIAGNOSIS — I10 Essential (primary) hypertension: Secondary | ICD-10-CM

## 2020-01-27 DIAGNOSIS — R072 Precordial pain: Secondary | ICD-10-CM

## 2020-01-27 DIAGNOSIS — Z712 Person consulting for explanation of examination or test findings: Secondary | ICD-10-CM

## 2020-01-27 DIAGNOSIS — R7303 Prediabetes: Secondary | ICD-10-CM

## 2020-01-27 MED ORDER — METOPROLOL SUCCINATE ER 25 MG PO TB24: 25.0000 mg | ORAL_TABLET | Freq: Every day | ORAL | 1 refills | Status: DC

## 2020-01-27 NOTE — Progress Notes (Signed)
[name] Date of Birth: 01-04-1946 MRN: [id number] Primary Care Provider:Bakare, Larey Dresser, MD Primary Cardiologist: Rex Kras, DO (established care 01/12/2020)  Date: 01/27/20 Last Office Visit: 01/12/2020  Chief Complaint  Patient presents with  . Chest Pain    follow up  . Results    HPI  Nancy Maddox is a 74 y.o. female who presents to the office with a chief complaint of " chest pain and review test results." Patient's past medical history and cardiac risk factors include: Hypertension, prediabetes, postmenopausal female, advanced age.  Patient is referred to the office at the request of her primary care provider for evaluation of chest pain back in April 2021. Patient is here for follow-up to re-evaluate her chest pain and to go over stress test results. I am seeing the patient for the first time for the above-mentioned chief complaint.  Since last office visit patient states that his chest pain has resolved. She has not had any additional episodes of chest discomfort as discussed during the initial consultation. Patient had a nuclear stress test which shows normal myocardial perfusion and overall low risk study. An echocardiogram reported preserved left ventricular systolic function. Patient stated that she used 1 tablet of sublingual nitroglycerin the night of the stress test for some minor chest discomfort. At last office visit she was also started on metoprolol given her angina discomfort. She has tolerated the initiation of metoprolol well without any side effects or intolerances. She is requesting a refill. She does not have any chest pain or shortness of breath at rest or with effort related activities. She states that her prior episode of chest discomfort was most likely anxiety driven as it occurred during a confrontation with her neighbor.  ALLERGIES: No Known Allergies   MEDICATION LIST PRIOR TO VISIT: Current Outpatient Medications on File Prior to Visit    Medication Sig Dispense Refill  . albuterol (VENTOLIN HFA) 108 (90 Base) MCG/ACT inhaler Inhale 2 puffs into the lungs every 6 hours as needed for shortness of breath    . amLODipine (NORVASC) 10 MG tablet Take 10 mg by mouth daily.     Marland Kitchen aspirin 81 MG EC tablet Take 81 mg by mouth daily.    . Calcium Carb-Cholecalciferol (CALTRATE 600+D3 SOFT) 600-800 MG-UNIT CHEW Caltrate 600 plus D  600 mg (1,500 mg)-800 unit chewable tablet  Take 1 tablet every day by oral route in the morning for 90 days.    . cyclobenzaprine (FLEXERIL) 5 MG tablet Take 1 tablet (5 mg total) by mouth 2 (two) times daily as needed for muscle spasms (pain). 15 tablet 0  . diclofenac Sodium (VOLTAREN) 1 % GEL Apply topically 4 (four) times daily.    . fluticasone (FLONASE) 50 MCG/ACT nasal spray Place 1 spray into both nostrils daily as needed for allergies.     . hydrochlorothiazide (HYDRODIURIL) 25 MG tablet Take 25 mg by mouth daily.    . hydrOXYzine (ATARAX/VISTARIL) 25 MG tablet Take 25 mg by mouth in the morning and at bedtime.    Marland Kitchen ibuprofen (ADVIL,MOTRIN) 200 MG tablet Take 200 mg by mouth daily as needed for headache or moderate pain.    . Liniments (SALONPAS PAIN RELIEF PATCH EX) Apply 1 patch topically 2 (two) times daily as needed (pain).    . Loratadine 10 MG CAPS Take 1 capsule by mouth daily as needed.    Marland Kitchen losartan (COZAAR) 100 MG tablet Take 100 mg by mouth daily.    . Multiple  Vitamin (MULTIVITAMIN WITH MINERALS) TABS tablet Take 1 tablet by mouth daily.    . nitroGLYCERIN (NITROSTAT) 0.4 MG SL tablet Place 0.4 mg under the tongue as needed.    Marland Kitchen omeprazole (PRILOSEC) 20 MG capsule Take 1 capsule (20 mg total) by mouth daily. 30 capsule 0  . ondansetron (ZOFRAN-ODT) 4 MG disintegrating tablet Take 1 tablet (4 mg total) by mouth 3 (three) times daily. 90 tablet 0  . potassium chloride (MICRO-K) 10 MEQ CR capsule potassium chloride ER 10 mEq capsule,extended release    . Tetrahydrozoline HCl (VISINE OP) Apply  1 drop to eye daily as needed (allergies).     Current Facility-Administered Medications on File Prior to Visit  Medication Dose Route Frequency Provider Last Rate Last Admin  . 0.9 %  sodium chloride infusion  500 mL Intravenous Once Nandigam, Venia Minks, MD        PAST MEDICAL HISTORY: Past Medical History:  Diagnosis Date  . Anemia   . Arthritis   . Complication of anesthesia    patient states that she was given too much anesthesia, too quickly prior to a C-section and could not see for 48 hours  . GERD (gastroesophageal reflux disease)   . Headache   . History of hiatal hernia   . Hypertension   . Pneumonia   . PONV (postoperative nausea and vomiting)   . Prediabetes   . Pulmonary edema    in setting of pregnancy '78    PAST SURGICAL HISTORY: Past Surgical History:  Procedure Laterality Date  . BREAST EXCISIONAL BIOPSY Right   . CARPAL TUNNEL RELEASE  05/2017  . CESAREAN SECTION    . CHOLECYSTECTOMY N/A 11/03/2017   Procedure: LAPAROSCOPIC CHOLECYSTECTOMY WITH ATTEMPTED INTRAOPERATIVE CHOLANGIOGRAM;  Surgeon: Georganna Skeans, MD;  Location: [facility];  Service: General;  Laterality: N/A;  . COLONOSCOPY WITH ESOPHAGOGASTRODUODENOSCOPY (EGD)    . FEMUR FRACTURE SURGERY    . UTERINE FIBROID SURGERY      FAMILY HISTORY: The patient family history includes Asthma in her son; Breast cancer in her maternal aunt; CAD in her sister; Colon cancer in her cousin; Diabetes in her sister; Heart attack in her father; Heart failure in her mother; Hypertension in her mother; Kidney disease in her mother.   SOCIAL HISTORY:  The patient  reports that she is a non-smoker but has been exposed to tobacco smoke. She has never used smokeless tobacco. She reports that she does not drink alcohol or use drugs.  REVIEW OF SYSTEMS: Review of Systems  Constitution: Negative for chills and fever.  HENT: Negative for ear discharge, ear pain and nosebleeds.   Eyes: Negative for blurred vision and  discharge.  Cardiovascular: Negative for chest pain, claudication, dyspnea on exertion, leg swelling, near-syncope and syncope.  Respiratory: Negative for cough and shortness of breath.   Endocrine: Negative for polydipsia, polyphagia and polyuria.  Hematologic/Lymphatic: Negative for bleeding problem.  Skin: Negative for flushing and nail changes.  Musculoskeletal: Negative for muscle cramps, muscle weakness and myalgias.  Gastrointestinal: Negative for abdominal pain, dysphagia, hematemesis, hematochezia, melena, nausea and vomiting.  Neurological: Positive for paresthesias (tingling in the arm. ). Negative for dizziness, focal weakness and light-headedness.   PHYSICAL EXAM: Vitals with BMI 01/27/2020 01/12/2020 11/03/2017  Height '5\' 4"'  '5\' 4"'  -  Weight 155 lbs 154 lbs -  BMI 45.62 56.38 -  Systolic 937 342 876  Diastolic 70 79 69  Pulse 79 85 76   CONSTITUTIONAL: Well-developed and well-nourished. No acute distress.  SKIN: Skin is warm and dry. No rash noted. No cyanosis. No pallor. No jaundice HEAD: Normocephalic and atraumatic.  EYES: No scleral icterus MOUTH/THROAT: Moist oral membranes.  NECK: No JVD present. No thyromegaly noted. No carotid bruits  LYMPHATIC: No visible cervical adenopathy.  CHEST Normal respiratory effort. No intercostal retractions  LUNGS: Clear to auscultation bilaterally. No stridor. No wheezes. No rales.  CARDIOVASCULAR: Regular rate and rhythm, positive S1-S2, no murmurs rubs or gallops appreciated ABDOMINAL: Soft, nontender, nondistended, positive bowel sounds in all 4 quadrants.  No apparent ascites.  EXTREMITIES: No peripheral edema  HEMATOLOGIC: No significant bruising NEUROLOGIC: Oriented to person, place, and time. Nonfocal. Normal muscle tone.  PSYCHIATRIC: Normal mood and affect. Normal behavior. Cooperative  CARDIAC DATABASE: EKG: 01/12/2020: Normal sinus rhythm, 78 bpm, normal axis deviation, left atrial enlargement, without underlying ischemia  or injury pattern.  Echocardiogram: 01/14/2020: LVEF 67%, mild concentric LVH, mild MR.  Stress Testing: Lexiscan Tetrofosmin stress test 01/21/2020:  Lexiscan nuclear stress test performed using 1-day protocol. Normal myocardial perfusion imaging. Stress LVEF 73%. Low risk study,  Heart Catheterization: None  LABORATORY DATA: External Labs: Collected: 12/28/2019 Creatinine 0.82 mg/dL. eGFR: 83 mL/min per 1.73 m Lipid profile: Total cholesterol 158, triglycerides 70, HDL 61, LDL 82 Hemoglobin A1c: 5.9 TSH: 0.76   CBC Latest Ref Rng & Units 10/29/2017 09/03/2017 02/29/2016  WBC 4.0 - 10.5 K/uL 5.0 3.9(L) 4.1(A)  Hemoglobin 12.0 - 15.0 g/dL 13.2 13.2 13.1  Hematocrit 36.0 - 46.0 % 40.8 40.2 38.5  Platelets 150 - 400 K/uL 254 203.0 -    CMP Latest Ref Rng & Units 10/29/2017 09/03/2017 02/29/2016  Glucose 65 - 99 mg/dL 87 93 87  BUN 6 - 20 mg/dL '12 15 14  ' Creatinine 0.44 - 1.00 mg/dL 0.88 0.91 0.83  Sodium 135 - 145 mmol/L 139 141 141  Potassium 3.5 - 5.1 mmol/L 3.4(L) 4.1 4.0  Chloride 101 - 111 mmol/L 102 105 100  CO2 22 - 32 mmol/L '26 31 31  ' Calcium 8.9 - 10.3 mg/dL 9.2 9.2 10.2  Total Protein 6.0 - 8.3 g/dL - 7.7 7.4  Total Bilirubin 0.2 - 1.2 mg/dL - 0.4 0.3  Alkaline Phos 39 - 117 U/L - 52 49  AST 0 - 37 U/L - 16 20  ALT 0 - 35 U/L - 13 16   FINAL MEDICATION LIST END OF ENCOUNTER: Meds ordered this encounter  Medications  . metoprolol succinate (TOPROL-XL) 25 MG 24 hr tablet    Sig: Take 1 tablet (25 mg total) by mouth daily. Hold if systolic blood pressure (top blood pressure number) less than 100 mmHg or heart rate less than 60 bpm (pulse).    Dispense:  90 tablet    Refill:  1    Medications Discontinued During This Encounter  Medication Reason  . Calcium Carbonate-Vitamin D (CALTRATE 497+N PO) Duplicate  . metoprolol succinate (TOPROL-XL) 25 MG 24 hr tablet Reorder     Current Outpatient Medications:  .  albuterol (VENTOLIN HFA) 108 (90 Base) MCG/ACT inhaler,  Inhale 2 puffs into the lungs every 6 hours as needed for shortness of breath, Disp: , Rfl:  .  amLODipine (NORVASC) 10 MG tablet, Take 10 mg by mouth daily. , Disp: , Rfl:  .  aspirin 81 MG EC tablet, Take 81 mg by mouth daily., Disp: , Rfl:  .  Calcium Carb-Cholecalciferol (CALTRATE 600+D3 SOFT) 600-800 MG-UNIT CHEW, Caltrate 600 plus D  600 mg (1,500 mg)-800 unit chewable tablet  Take 1 tablet  every day by oral route in the morning for 90 days., Disp: , Rfl:  .  cyclobenzaprine (FLEXERIL) 5 MG tablet, Take 1 tablet (5 mg total) by mouth 2 (two) times daily as needed for muscle spasms (pain)., Disp: 15 tablet, Rfl: 0 .  diclofenac Sodium (VOLTAREN) 1 % GEL, Apply topically 4 (four) times daily., Disp: , Rfl:  .  fluticasone (FLONASE) 50 MCG/ACT nasal spray, Place 1 spray into both nostrils daily as needed for allergies. , Disp: , Rfl:  .  hydrochlorothiazide (HYDRODIURIL) 25 MG tablet, Take 25 mg by mouth daily., Disp: , Rfl:  .  hydrOXYzine (ATARAX/VISTARIL) 25 MG tablet, Take 25 mg by mouth in the morning and at bedtime., Disp: , Rfl:  .  ibuprofen (ADVIL,MOTRIN) 200 MG tablet, Take 200 mg by mouth daily as needed for headache or moderate pain., Disp: , Rfl:  .  Liniments (SALONPAS PAIN RELIEF PATCH EX), Apply 1 patch topically 2 (two) times daily as needed (pain)., Disp: , Rfl:  .  Loratadine 10 MG CAPS, Take 1 capsule by mouth daily as needed., Disp: , Rfl:  .  losartan (COZAAR) 100 MG tablet, Take 100 mg by mouth daily., Disp: , Rfl:  .  metoprolol succinate (TOPROL-XL) 25 MG 24 hr tablet, Take 1 tablet (25 mg total) by mouth daily. Hold if systolic blood pressure (top blood pressure number) less than 100 mmHg or heart rate less than 60 bpm (pulse)., Disp: 90 tablet, Rfl: 1 .  Multiple Vitamin (MULTIVITAMIN WITH MINERALS) TABS tablet, Take 1 tablet by mouth daily., Disp: , Rfl:  .  nitroGLYCERIN (NITROSTAT) 0.4 MG SL tablet, Place 0.4 mg under the tongue as needed., Disp: , Rfl:  .  omeprazole  (PRILOSEC) 20 MG capsule, Take 1 capsule (20 mg total) by mouth daily., Disp: 30 capsule, Rfl: 0 .  ondansetron (ZOFRAN-ODT) 4 MG disintegrating tablet, Take 1 tablet (4 mg total) by mouth 3 (three) times daily., Disp: 90 tablet, Rfl: 0 .  potassium chloride (MICRO-K) 10 MEQ CR capsule, potassium chloride ER 10 mEq capsule,extended release, Disp: , Rfl:  .  Tetrahydrozoline HCl (VISINE OP), Apply 1 drop to eye daily as needed (allergies)., Disp: , Rfl:   Current Facility-Administered Medications:  .  0.9 %  sodium chloride infusion, 500 mL, Intravenous, Once, Nandigam, Venia Minks, MD  IMPRESSION:    ICD-10-CM   1. Precordial chest pain  R07.2 metoprolol succinate (TOPROL-XL) 25 MG 24 hr tablet  2. Essential hypertension  I10   3. Prediabetes  R73.03   4. Encounter to discuss test results  Z71.2      RECOMMENDATIONS: Nancy Maddox is a 74 y.o. female whose past medical history and cardiac risk factors include: Hypertension, prediabetes, postmenopausal female, advanced age.  Precordial chest pain:  Patient's symptoms of chest discomfort have resolved.  Patient underwent a nuclear stress test and echocardiogram since last office visit and the results are discussed with the patient in great detail. Summarized findings noted above.  Patient tolerated initiation of beta-blocker well without any side effects or intolerances.  We will refill metoprolol at today's visit.  Benign essential hypertension: Currently managed by primary team.  Prediabetes: Currently managed by primary team.  --Continue cardiac medications as reconciled in final medication list. --Return in about 6 months (around 07/28/2020) for re-evaluation of symptoms.. Or sooner if needed. --Continue follow-up with your primary care physician regarding the management of your other chronic comorbid conditions.  Patient's questions and concerns were addressed to her satisfaction.  She voices understanding of the instructions  provided during this encounter.   This note was created using a voice recognition software as a result there may be grammatical errors inadvertently enclosed that do not reflect the nature of this encounter. Every attempt is made to correct such errors.  Rex Kras, [place], [facility]  Pager: [phone] Office: [phone]

## 2020-03-01 ENCOUNTER — Ambulatory Visit
Admission: RE | Admit: 2020-03-01 | Discharge: 2020-03-01 | Disposition: A | Discharge status: Home / Self Care | Payer: Medicare HMO | Source: Ambulatory Visit | Attending: Internal Medicine | Admitting: Internal Medicine

## 2020-03-01 ENCOUNTER — Other Ambulatory Visit: Payer: Self-pay

## 2020-03-01 DIAGNOSIS — Z1231 Encounter for screening mammogram for malignant neoplasm of breast: Secondary | ICD-10-CM

## 2020-03-03 ENCOUNTER — Other Ambulatory Visit: Payer: Self-pay | Admitting: Internal Medicine

## 2020-03-03 DIAGNOSIS — R928 Other abnormal and inconclusive findings on diagnostic imaging of breast: Secondary | ICD-10-CM

## 2020-03-09 ENCOUNTER — Other Ambulatory Visit: Payer: Self-pay | Admitting: Internal Medicine

## 2020-03-09 ENCOUNTER — Ambulatory Visit
Admission: RE | Admit: 2020-03-09 | Discharge: 2020-03-09 | Disposition: A | Discharge status: Home / Self Care | Payer: Medicare HMO | Source: Ambulatory Visit | Attending: Internal Medicine | Admitting: Internal Medicine

## 2020-03-09 ENCOUNTER — Other Ambulatory Visit: Payer: Self-pay

## 2020-03-09 DIAGNOSIS — R928 Other abnormal and inconclusive findings on diagnostic imaging of breast: Secondary | ICD-10-CM

## 2020-07-27 ENCOUNTER — Other Ambulatory Visit: Payer: Self-pay

## 2020-07-27 ENCOUNTER — Encounter: Payer: Self-pay | Admitting: Cardiology

## 2020-07-27 ENCOUNTER — Ambulatory Visit: Payer: Medicare HMO | Admitting: Cardiology

## 2020-07-27 VITALS — BP 129/68 | HR 83 | Ht <= 58 in | Wt 151.0 lb

## 2020-07-27 DIAGNOSIS — R7303 Prediabetes: Secondary | ICD-10-CM

## 2020-07-27 DIAGNOSIS — R072 Precordial pain: Secondary | ICD-10-CM

## 2020-07-27 DIAGNOSIS — I10 Essential (primary) hypertension: Secondary | ICD-10-CM

## 2020-07-27 NOTE — Progress Notes (Signed)
[name] Date of Birth: October 09, 1945 MRN: [id number] Primary Care Provider:Bakare, Larey Dresser, MD Primary Cardiologist: Rex Kras, DO (established care 01/12/2020)  Date: 07/27/20 Last Office Visit: 01/27/2020  Chief Complaint  Patient presents with  . Chest Pain  . Follow-up    HPI  Nancy Maddox is a 74 y.o. female who presents to the office with a chief complaint of " reevaluation of chest pain." Patient's past medical history and cardiac risk factors include: Hypertension, prediabetes, postmenopausal female, advanced age.  Patient is referred to the office at the request of her primary care provider for evaluation of chest pain back in April 2021.   Patient has undergone extensive evaluation from a cardiovascular standpoint.  Patient's LVEF is preserved and stress test was reported to be overall low risk study.  She now presents for 8-month follow-up for reevaluation of chest pain.  Patient states that since last office visit she has not had any anginal chest pain or shortness of breath at rest or with effort related activities.  Patient states that her underlying stress level is also improved since last office visit which may be playing a contributory factor to how she felt in the past.   No hospitalizations or urgent care visits since last encounter.  ALLERGIES: No Known Allergies   MEDICATION LIST PRIOR TO VISIT: Current Outpatient Medications on File Prior to Visit  Medication Sig Dispense Refill  . albuterol (VENTOLIN HFA) 108 (90 Base) MCG/ACT inhaler Inhale 2 puffs into the lungs every 6 hours as needed for shortness of breath    . amLODipine (NORVASC) 10 MG tablet Take 10 mg by mouth daily.     Marland Kitchen aspirin 81 MG EC tablet Take 81 mg by mouth daily.    . Calcium Carb-Cholecalciferol (CALTRATE 600+D3 SOFT) 600-800 MG-UNIT CHEW Caltrate 600 plus D  600 mg (1,500 mg)-800 unit chewable tablet  Take 1 tablet every day by oral route in the morning for 90 days.    .  cyclobenzaprine (FLEXERIL) 5 MG tablet Take 1 tablet (5 mg total) by mouth 2 (two) times daily as needed for muscle spasms (pain). 15 tablet 0  . diclofenac Sodium (VOLTAREN) 1 % GEL Apply topically 4 (four) times daily.    . fluticasone (FLONASE) 50 MCG/ACT nasal spray Place 1 spray into both nostrils daily as needed for allergies.     . hydrochlorothiazide (HYDRODIURIL) 25 MG tablet Take 25 mg by mouth daily.    . hydrOXYzine (ATARAX/VISTARIL) 25 MG tablet Take 25 mg by mouth as needed for vomiting.     Marland Kitchen ibuprofen (ADVIL,MOTRIN) 200 MG tablet Take 200 mg by mouth daily as needed for headache or moderate pain.    . Liniments (SALONPAS PAIN RELIEF PATCH EX) Apply 1 patch topically 2 (two) times daily as needed (pain).    . Loratadine 10 MG CAPS Take 1 capsule by mouth daily as needed.    Marland Kitchen losartan (COZAAR) 100 MG tablet Take 100 mg by mouth daily.    . metoprolol succinate (TOPROL-XL) 25 MG 24 hr tablet Take 1 tablet (25 mg total) by mouth daily. Hold if systolic blood pressure (top blood pressure number) less than 100 mmHg or heart rate less than 60 bpm (pulse). 90 tablet 1  . Multiple Vitamin (MULTIVITAMIN WITH MINERALS) TABS tablet Take 1 tablet by mouth daily.    . nitroGLYCERIN (NITROSTAT) 0.4 MG SL tablet Place 0.4 mg under the tongue as needed.    Marland Kitchen omeprazole (PRILOSEC) 20 MG capsule  Take 1 capsule (20 mg total) by mouth daily. 30 capsule 0  . ondansetron (ZOFRAN-ODT) 4 MG disintegrating tablet Take 1 tablet (4 mg total) by mouth 3 (three) times daily. 90 tablet 0  . potassium chloride (MICRO-K) 10 MEQ CR capsule potassium chloride ER 10 mEq capsule,extended release    . Tetrahydrozoline HCl (VISINE OP) Apply 1 drop to eye daily as needed (allergies).     No current facility-administered medications on file prior to visit.    PAST MEDICAL HISTORY: Past Medical History:  Diagnosis Date  . Anemia   . Arthritis   . Complication of anesthesia    patient states that she was given too  much anesthesia, too quickly prior to a C-section and could not see for 48 hours  . GERD (gastroesophageal reflux disease)   . Headache   . History of hiatal hernia   . Hypertension   . Pneumonia   . PONV (postoperative nausea and vomiting)   . Prediabetes   . Pulmonary edema    in setting of pregnancy '78    PAST SURGICAL HISTORY: Past Surgical History:  Procedure Laterality Date  . BREAST EXCISIONAL BIOPSY Right   . CARPAL TUNNEL RELEASE  05/2017  . CESAREAN SECTION    . CHOLECYSTECTOMY N/A 11/03/2017   Procedure: LAPAROSCOPIC CHOLECYSTECTOMY WITH ATTEMPTED INTRAOPERATIVE CHOLANGIOGRAM;  Surgeon: Georganna Skeans, MD;  Location: [place];  Service: General;  Laterality: N/A;  . COLONOSCOPY WITH ESOPHAGOGASTRODUODENOSCOPY (EGD)    . FEMUR FRACTURE SURGERY    . UTERINE FIBROID SURGERY      FAMILY HISTORY: The patient family history includes Asthma in her son; Breast cancer in her maternal aunt; CAD in her sister; Colon cancer in her cousin; Diabetes in her sister; Heart attack in her father; Heart failure in her mother; Hypertension in her mother; Kidney disease in her mother.   SOCIAL HISTORY:  The patient  reports that she is a non-smoker but has been exposed to tobacco smoke. She has never used smokeless tobacco. She reports that she does not drink alcohol and does not use drugs.  REVIEW OF SYSTEMS: Review of Systems  Constitutional: Negative for chills and fever.  HENT: Negative for ear discharge, ear pain and nosebleeds.   Eyes: Negative for blurred vision and discharge.  Cardiovascular: Negative for chest pain, claudication, dyspnea on exertion, leg swelling, near-syncope and syncope.  Respiratory: Negative for cough and shortness of breath.   Endocrine: Negative for polydipsia, polyphagia and polyuria.  Hematologic/Lymphatic: Negative for bleeding problem.  Skin: Negative for flushing and nail changes.  Musculoskeletal: Negative for muscle cramps, muscle weakness and  myalgias.  Gastrointestinal: Negative for abdominal pain, dysphagia, hematemesis, hematochezia, melena, nausea and vomiting.  Neurological: Negative for dizziness, focal weakness, light-headedness and paresthesias.   PHYSICAL EXAM: Vitals with BMI 07/27/2020 01/27/2020 01/12/2020  Height 3\' 0"  5\' 4"  5\' 4"   Weight 151 lbs 155 lbs 154 lbs  BMI 81.99 97.67 34.19  Systolic 379 024 097  Diastolic 68 70 79  Pulse 83 79 85   CONSTITUTIONAL: Well-developed and well-nourished. No acute distress.  SKIN: Skin is warm and dry. No rash noted. No cyanosis. No pallor. No jaundice HEAD: Normocephalic and atraumatic.  EYES: No scleral icterus MOUTH/THROAT: Moist oral membranes.  NECK: No JVD present. No thyromegaly noted. No carotid bruits  LYMPHATIC: No visible cervical adenopathy.  CHEST Normal respiratory effort. No intercostal retractions  LUNGS: Clear to auscultation bilaterally. No stridor. No wheezes. No rales.  CARDIOVASCULAR: Regular rate and rhythm, positive  S1-S2, no murmurs rubs or gallops appreciated ABDOMINAL: Soft, nontender, nondistended, positive bowel sounds in all 4 quadrants.  No apparent ascites.  EXTREMITIES: No peripheral edema  HEMATOLOGIC: No significant bruising NEUROLOGIC: Oriented to person, place, and time. Nonfocal. Normal muscle tone.  PSYCHIATRIC: Normal mood and affect. Normal behavior. Cooperative  CARDIAC DATABASE: EKG: 07/27/2020: Normal sinus rhythm, 75 bpm, without underlying ischemia or injury pattern.  Echocardiogram: 01/14/2020: LVEF 67%, mild concentric LVH, mild MR.  Stress Testing: Lexiscan Tetrofosmin stress test 01/21/2020:  Lexiscan nuclear stress test performed using 1-day protocol. Normal myocardial perfusion imaging. Stress LVEF 73%. Low risk study,  Heart Catheterization: None  LABORATORY DATA: External Labs: Collected: 12/28/2019 Creatinine 0.82 mg/dL. eGFR: 83 mL/min per 1.73 m Lipid profile: Total cholesterol 158, triglycerides 70, HDL  61, LDL 82 Hemoglobin A1c: 5.9 TSH: 0.76   CBC Latest Ref Rng & Units 10/29/2017 09/03/2017 02/29/2016  WBC 4.0 - 10.5 K/uL 5.0 3.9(L) 4.1(A)  Hemoglobin 12.0 - 15.0 g/dL 13.2 13.2 13.1  Hematocrit 36 - 46 % 40.8 40.2 38.5  Platelets 150 - 400 K/uL 254 203.0 -    CMP Latest Ref Rng & Units 10/29/2017 09/03/2017 02/29/2016  Glucose 65 - 99 mg/dL 87 93 87  BUN 6 - 20 mg/dL _0 Creatinine 0.44 - 1.00 mg/dL 0.88 0.91 0.83  Sodium 135 - 145 mmol/L 139 141 141  Potassium 3.5 - 5.1 mmol/L 3.4(L) 4.1 4.0  Chloride 101 - 111 mmol/L 102 105 100  CO2 22 - 32 mmol/L _1 Calcium 8.9 - 10.3 mg/dL 9.2 9.2 10.2  Total Protein 6.0 - 8.3 g/dL - 7.7 7.4  Total Bilirubin 0.2 - 1.2 mg/dL - 0.4 0.3  Alkaline Phos 39 - 117 U/L - 52 49  AST 0 - 37 U/L - 16 20  ALT 0 - 35 U/L - 13 16   FINAL MEDICATION LIST END OF ENCOUNTER: No orders of the defined types were placed in this encounter.   Medications Discontinued During This Encounter  Medication Reason  . 0.9 %  sodium chloride infusion      Current Outpatient Medications:  .  albuterol (VENTOLIN HFA) 108 (90 Base) MCG/ACT inhaler, Inhale 2 puffs into the lungs every 6 hours as needed for shortness of breath, Disp: , Rfl:  .  amLODipine (NORVASC) 10 MG tablet, Take 10 mg by mouth daily. , Disp: , Rfl:  .  aspirin 81 MG EC tablet, Take 81 mg by mouth daily., Disp: , Rfl:  .  Calcium Carb-Cholecalciferol (CALTRATE 600+D3 SOFT) 600-800 MG-UNIT CHEW, Caltrate 600 plus D  600 mg (1,500 mg)-800 unit chewable tablet  Take 1 tablet every day by oral route in the morning for 90 days., Disp: , Rfl:  .  cyclobenzaprine (FLEXERIL) 5 MG tablet, Take 1 tablet (5 mg total) by mouth 2 (two) times daily as needed for muscle spasms (pain)., Disp: 15 tablet, Rfl: 0 .  diclofenac Sodium (VOLTAREN) 1 % GEL, Apply topically 4 (four) times daily., Disp: , Rfl:  .  fluticasone (FLONASE) 50 MCG/ACT nasal spray, Place 1 spray into both nostrils daily as needed for  allergies. , Disp: , Rfl:  .  hydrochlorothiazide (HYDRODIURIL) 25 MG tablet, Take 25 mg by mouth daily., Disp: , Rfl:  .  hydrOXYzine (ATARAX/VISTARIL) 25 MG tablet, Take 25 mg by mouth as needed for vomiting. , Disp: , Rfl:  .  ibuprofen (ADVIL,MOTRIN) 200 MG tablet, Take 200 mg by mouth daily as needed for headache  or moderate pain., Disp: , Rfl:  .  Liniments (SALONPAS PAIN RELIEF PATCH EX), Apply 1 patch topically 2 (two) times daily as needed (pain)., Disp: , Rfl:  .  Loratadine 10 MG CAPS, Take 1 capsule by mouth daily as needed., Disp: , Rfl:  .  losartan (COZAAR) 100 MG tablet, Take 100 mg by mouth daily., Disp: , Rfl:  .  metoprolol succinate (TOPROL-XL) 25 MG 24 hr tablet, Take 1 tablet (25 mg total) by mouth daily. Hold if systolic blood pressure (top blood pressure number) less than 100 mmHg or heart rate less than 60 bpm (pulse)., Disp: 90 tablet, Rfl: 1 .  Multiple Vitamin (MULTIVITAMIN WITH MINERALS) TABS tablet, Take 1 tablet by mouth daily., Disp: , Rfl:  .  nitroGLYCERIN (NITROSTAT) 0.4 MG SL tablet, Place 0.4 mg under the tongue as needed., Disp: , Rfl:  .  omeprazole (PRILOSEC) 20 MG capsule, Take 1 capsule (20 mg total) by mouth daily., Disp: 30 capsule, Rfl: 0 .  ondansetron (ZOFRAN-ODT) 4 MG disintegrating tablet, Take 1 tablet (4 mg total) by mouth 3 (three) times daily., Disp: 90 tablet, Rfl: 0 .  potassium chloride (MICRO-K) 10 MEQ CR capsule, potassium chloride ER 10 mEq capsule,extended release, Disp: , Rfl:  .  Tetrahydrozoline HCl (VISINE OP), Apply 1 drop to eye daily as needed (allergies)., Disp: , Rfl:   IMPRESSION:    ICD-10-CM   1. Precordial chest pain  R07.2 EKG 12-Lead  2. Prediabetes  R73.03   3. Essential hypertension  I10      RECOMMENDATIONS: JALINA BLOWERS is a 74 y.o. female whose past medical history and cardiac risk factors include: Hypertension, prediabetes, postmenopausal female, advanced age.  Precordial chest pain: Resolved  Since last  office visit patient has not had any recurrence of chest discomfort or angina.    Reviewed the echocardiogram and stress test findings again with the patient to provide reassurance.  No changes in medications or medical therapy.    Patient is asked to return back sooner if symptoms resurface and if closed go to the nearest ER via EMS for further evaluation and management.    Medications reconciled.  Benign essential hypertension: Currently managed by primary team.  Prediabetes: Currently managed by primary team.  --Continue cardiac medications as reconciled in final medication list. --Return in about 1 year (around 07/27/2021) for Reevaluation of, Chest pain. Or sooner if needed. --Continue follow-up with your primary care physician regarding the management of your other chronic comorbid conditions.  Patient's questions and concerns were addressed to her satisfaction. She voices understanding of the instructions provided during this encounter.   This note was created using a voice recognition software as a result there may be grammatical errors inadvertently enclosed that do not reflect the nature of this encounter. Every attempt is made to correct such errors.  Rex Kras, [place], [facility]  Pager: [phone] Office: [phone]

## 2020-07-28 ENCOUNTER — Ambulatory Visit: Payer: Medicare HMO | Admitting: Cardiology

## 2020-08-12 ENCOUNTER — Ambulatory Visit: Payer: Medicare HMO | Attending: Internal Medicine

## 2020-08-12 DIAGNOSIS — Z23 Encounter for immunization: Secondary | ICD-10-CM

## 2020-08-12 NOTE — Progress Notes (Signed)
   Covid-19 Vaccination Clinic  Name:  Nancy Maddox    MRN: [id number] DOB: Jun 07, 1946  08/12/2020  Nancy Maddox was observed post Covid-19 immunization for 15 minutes without incident. She was provided with Vaccine Information Sheet and instruction to access the V-Safe system.   Nancy Maddox was instructed to call 911 with any severe reactions post vaccine: Marland Kitchen Difficulty breathing  . Swelling of face and throat  . A fast heartbeat  . A bad rash all over body  . Dizziness and weakness

## 2020-10-05 ENCOUNTER — Encounter: Payer: Self-pay | Admitting: Physical Therapy

## 2020-10-05 ENCOUNTER — Ambulatory Visit: Payer: Medicare HMO | Attending: Internal Medicine | Admitting: Physical Therapy

## 2020-10-05 ENCOUNTER — Other Ambulatory Visit: Payer: Self-pay

## 2020-10-05 DIAGNOSIS — M6281 Muscle weakness (generalized): Secondary | ICD-10-CM | POA: Diagnosis present

## 2020-10-05 DIAGNOSIS — R262 Difficulty in walking, not elsewhere classified: Secondary | ICD-10-CM | POA: Diagnosis present
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
## 2020-10-17 ENCOUNTER — Ambulatory Visit: Payer: Medicare HMO

## 2020-10-24 ENCOUNTER — Other Ambulatory Visit: Payer: Self-pay

## 2020-10-24 ENCOUNTER — Ambulatory Visit: Payer: Medicare HMO | Admitting: Physical Therapy

## 2020-10-24 ENCOUNTER — Encounter: Payer: Self-pay | Admitting: Physical Therapy

## 2020-10-24 DIAGNOSIS — M6281 Muscle weakness (generalized): Secondary | ICD-10-CM

## 2020-10-24 DIAGNOSIS — R262 Difficulty in walking, not elsewhere classified: Secondary | ICD-10-CM

## 2020-10-24 NOTE — Patient Instructions (Signed)
    Voncille Lo, PT, [place] Certified Exercise Expert for the Aging Adult  10/24/20 2:23 PM Phone: [phone] Fax: [phone]

## 2020-10-24 NOTE — Therapy (Signed)
[place], [place], [address] Phone: [phone]   Fax:  [phone]  Physical Therapy Treatment  Patient Details  Name: Nancy Maddox MRN: [id number] Date of Birth: 03/12/1946 Referring Provider (PT): Latanya Presser, MD   Encounter Date: 10/24/2020   PT End of Session - 10/24/20 1339    Visit Number 2    Number of Visits 12    Date for PT Re-Evaluation 11/16/20    Authorization Type [employer] MCR-requesting visit authorization    PT Start Time 1338    PT Stop Time 1425    PT Time Calculation (min) 47 min    Activity Tolerance Patient tolerated treatment well    Behavior During Therapy [facility] for tasks assessed/performed           Past Medical History:  Diagnosis Date  . Anemia   . Arthritis   . Complication of anesthesia    patient states that she was given too much anesthesia, too quickly prior to a C-section and could not see for 48 hours  . GERD (gastroesophageal reflux disease)   . Headache   . History of hiatal hernia   . Hypertension   . Pneumonia   . PONV (postoperative nausea and vomiting)   . Prediabetes   . Pulmonary edema    in setting of pregnancy '78    Past Surgical History:  Procedure Laterality Date  . BREAST EXCISIONAL BIOPSY Right   . CARPAL TUNNEL RELEASE  05/2017  . CESAREAN SECTION    . CHOLECYSTECTOMY N/A 11/03/2017   Procedure: LAPAROSCOPIC CHOLECYSTECTOMY WITH ATTEMPTED INTRAOPERATIVE CHOLANGIOGRAM;  Surgeon: Georganna Skeans, MD;  Location: [place];  Service: General;  Laterality: N/A;  . COLONOSCOPY WITH ESOPHAGOGASTRODUODENOSCOPY (EGD)    . FEMUR FRACTURE SURGERY    . UTERINE FIBROID SURGERY      There were no vitals filed for this visit.   Subjective Assessment - 10/24/20 1344    Subjective I had to sit and rest a lot with my exercises from last time,    Pertinent History arthritis with knee pain rigt>left, HTN, prediabetic, history of pulmonary edema     Limitations Walking;Standing;House hold activities    Patient Stated Goals Improve walking/ability for activity    Currently in Pain? No/denies              [facility] PT Assessment - 10/24/20 0001      Berg Balance Test   Sit to Stand Able to stand  independently using hands    Standing Unsupported Able to stand safely 2 minutes    Sitting with Back Unsupported but Feet Supported on Floor or Stool Able to sit safely and securely 2 minutes    Stand to Sit Uses backs of legs against chair to control descent    Transfers Able to transfer safely, definite need of hands    Standing Unsupported with Eyes Closed Able to stand 10 seconds safely    Standing Unsupported with Feet Together Able to place feet together independently and stand 1 minute safely    From Standing, Reach Forward with Outstretched Arm Can reach forward >5 cm safely (2")    From Standing Position, Pick up Object from Floor Able to pick up shoe safely and easily    From Standing Position, Turn to Look Behind Over each Shoulder Looks behind one side only/other side shows less weight shift   harder to turn on the left   Turn 360 Degrees Able to turn 360  degrees safely but slowly    Standing Unsupported, Alternately Place Feet on Step/Stool Able to stand independently and complete 8 steps >20 seconds    Standing Unsupported, One Foot in Front Able to plae foot ahead of the other independently and hold 30 seconds    Standing on One Leg Able to lift leg independently and hold 5-10 seconds   9 sec on left and 5 or R   Total Score 44    Berg comment: 44/56  must use  an AD at all times  has a rollator, stand up cane and walker                         [facility] Adult PT Treatment/Exercise - 10/24/20 0001      Knee/Hip Exercises: Stretches   Hip Flexor Stretch Limitations supine hooklying 3 x 30 on R off side of bed 3 x 30 sec      Knee/Hip Exercises: Standing   Heel Raises Both;10 reps;2 sets    Heel Raises Limitations  with UE support    Abduction Limitations in ll bars red t band around knee 2  x 15 feet to R and then to L    Hip Extension Stengthening;Right;Left;2 sets;10 reps    Extension Limitations at counter with UE support as needed    Other Standing Knee Exercises stand at counter trunk rotation using UE support(sliding hand across body as far as possible in pain free range. counter marching R and L as high as possible with UE support 2 x 10    Other Standing Knee Exercises sit to stand with arms crossed x 5 ( tends to lock knees onto chair for stability,  sink squat with chair touch in back 2 x 10 with UE support as needed      Knee/Hip Exercises: Seated   Long Arc [employer]    Long Arc Quad Limitations red t band 2 x 10 R and L    Clamshell with TheraBand Red   1 x 10     Manual Therapy   Manual Therapy Soft tissue mobilization    Soft tissue mobilization STW to R quad and hip flexor with contract relax of knee to increase knee AROM post stretch , increased comfort while walking                  PT Education - 10/24/20 1423    Education Details added standing ex and hip flexor stretch  BERG balance explanation and need for AD at all times.  44/56    Person(s) Educated Patient    Methods Explanation;Demonstration;Tactile cues;Verbal cues;Handout    Comprehension Verbalized understanding;Returned demonstration            PT Short Term Goals - 10/05/20 1547      PT SHORT TERM GOAL #1   Title Independent with initial HEP    Baseline instructed at eval    Time 3    Period Weeks    Status New      PT SHORT TERM GOAL #2   Title Assess Berg to establish LTG for balance/fall risk by visit 3    Time 3    Period Weeks    Status New             PT Long Term Goals - 10/05/20 1548      PT LONG TERM GOAL #1   Title Improve 5 times sit<>stand time to 15 sec or less  to improve ability for transfers from low seats    Baseline 23.85 seconds    Time 6    Period Weeks     Status New    Target Date 11/16/20      PT LONG TERM GOAL #2   Title Improve 2 minute walk test distance to 185 feet or greater to improve ability to tolerance ambulation for activities such as grocery shopping    Baseline 141 feet    Time 6    Period Weeks    Status New    Target Date 11/16/20      PT LONG TERM GOAL #3   Title Achieve bilateral knee strength of 5/5 and increase bilat. hip strength grossly 1/2 MMT grade to improve ability to navigate stairs to home entrance and perform transfers from low seats    Baseline see objective    Time 6    Period Weeks    Status New    Target Date 11/16/20      PT LONG TERM GOAL #4   Title Improve FOTO outcome measure score to 38% lesss limitation    Baseline 48% limited    Time 6    Period Weeks    Status New    Target Date 11/16/20      PT LONG TERM GOAL #5   Title Berg Balance goal to be determined on assessment                 Plan - 10/24/20 1341    Clinical Impression Statement Ms Higley presents with walking cane and undergoes BERG balance test  44/56 score indicating need for full time AD, Explained importance and that cruising and using UE on walls and counters for ambulation is not as safe as using an assistive device   Pt with tight hip flexors on R greater than L,  Manuals therapy with contract/ relax method to increase quad and hip flexor in supine.  Added standing exercises to HEP today   Will continue to strengthen to decrease fall risk.    Personal Factors and Comorbidities Comorbidity 2;Age;Time since onset of injury/illness/exacerbation    Examination-Activity Limitations Transfers;Lift;Squat;Stairs;Locomotion Level;Stand    Examination-Participation Restrictions Meal Prep;Community Activity;Cleaning;Shop    PT Treatment/Interventions ADLs/Self Care Home Management;Cryotherapy;Electrical Stimulation;Moist Heat;Gait training;Stair training;Balance training;Functional mobility training;Neuromuscular  re-education;Therapeutic activities;Therapeutic exercise;Patient/family education;Manual techniques    PT Next Visit Plan Assess Berg, NUSTEP warm up, work on general LE strengthening, knee and hip/proximal stability hip weakness worse on left, balance/proprioceptive challenges, gait training prn    PT Home Exercise Plan Access code: [email or web address]           Patient will benefit from skilled therapeutic intervention in order to improve the following deficits and impairments:  Pain,Decreased balance,Difficulty walking,Decreased endurance,Decreased strength,Decreased activity tolerance,Cardiopulmonary status limiting activity  Visit Diagnosis: Muscle weakness (generalized)  Difficulty in walking, not elsewhere classified  Access Code: [email or web address]: [email or web address]: 01/25/2022Prepared by: Donnetta Simpers BeardsleyExercises    Standing March with Counter Support - 1 x daily - 7 x weekly - 3 sets - 10 reps  Standing Hip Extension with Counter Support - 1 x daily - 7 x weekly - 3 sets - 10 reps  Modified Thomas Stretch - 1 x daily - 7 x weekly - 1 sets - 3 reps - 30 hold    Problem List Patient Active Problem List   Diagnosis Date Noted  . Lumbosacral radiculopathy 06/21/2016  . 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 Vision loss of right  eye 02/29/2016  . Unsteady gait 02/29/2016  . Nausea alone 02/05/2013  . Hypertension 02/03/2013  . Left hand pain 02/03/2013  . Cough 02/03/2013    Voncille Lo, PT, [place] Certified Exercise Expert for the Aging Adult  10/24/20 2:44 PM Phone: [phone] Fax: [phone]  [facility] [address] [place], [place], [address] Phone: [phone]   Fax:  [phone]  Name: Nancy Maddox MRN: [id number] Date of Birth: 01-21-1946

## 2020-10-26 ENCOUNTER — Other Ambulatory Visit: Payer: Self-pay

## 2020-10-26 ENCOUNTER — Ambulatory Visit: Payer: Medicare HMO | Admitting: Physical Therapy

## 2020-10-26 ENCOUNTER — Encounter: Payer: Self-pay | Admitting: Physical Therapy

## 2020-10-26 VITALS — HR 80

## 2020-10-26 DIAGNOSIS — M6281 Muscle weakness (generalized): Secondary | ICD-10-CM

## 2020-10-26 DIAGNOSIS — R262 Difficulty in walking, not elsewhere classified: Secondary | ICD-10-CM

## 2020-10-26 NOTE — Therapy (Signed)
[place], [place], [address] Phone: [phone]   Fax:  [phone]  Physical Therapy Treatment  Patient Details  Name: Nancy Maddox MRN: [id number] Date of Birth: 1946/09/27 Referring Provider (PT): Latanya Presser, MD   Encounter Date: 10/26/2020   PT End of Session - 10/26/20 1801    Visit Number 3    Number of Visits 12    Date for PT Re-Evaluation 11/16/20    Authorization Type [employer] MCR    Authorization Time Period 10/05/20-12/01/20    Authorization - Visit Number 3    Authorization - Number of Visits 12    PT Start Time 1708    PT Stop Time 1962    PT Time Calculation (min) 47 min    Activity Tolerance Patient limited by fatigue    Behavior During Therapy [facility] for tasks assessed/performed           Past Medical History:  Diagnosis Date   Anemia    Arthritis    Complication of anesthesia    patient states that she was given too much anesthesia, too quickly prior to a C-section and could not see for 48 hours   GERD (gastroesophageal reflux disease)    Headache    History of hiatal hernia    Hypertension    Pneumonia    PONV (postoperative nausea and vomiting)    Prediabetes    Pulmonary edema    in setting of pregnancy '78    Past Surgical History:  Procedure Laterality Date   BREAST EXCISIONAL BIOPSY Right    CARPAL TUNNEL RELEASE  05/2017   CESAREAN SECTION     CHOLECYSTECTOMY N/A 11/03/2017   Procedure: LAPAROSCOPIC CHOLECYSTECTOMY WITH ATTEMPTED INTRAOPERATIVE CHOLANGIOGRAM;  Surgeon: Georganna Skeans, MD;  Location: [facility];  Service: General;  Laterality: N/A;   COLONOSCOPY WITH ESOPHAGOGASTRODUODENOSCOPY (EGD)     FEMUR FRACTURE SURGERY     UTERINE FIBROID SURGERY      Vitals:   10/26/20 1726  Pulse: 80  SpO2: 96%     Subjective Assessment - 10/26/20 1714    Subjective Pt. reports some muscle soreness the day after last therapy session. She reports her  left wrist pain (history chronic wrist pain with history carpal tunnel surgery) has flared up today so used "pain patch" to ease symptoms and is wearing left wrist brace but no pain pre-tx. for therapy session. Pt. using cane now to ambulate ("Hurry"-type cane)    Pertinent History arthritis with knee pain rigt>left, HTN, prediabetic, history of pulmonary edema    Currently in Pain? No/denies                             [facility] Adult PT Treatment/Exercise - 10/26/20 0001      Exercises   Exercises Shoulder      Knee/Hip Exercises: Aerobic   Nustep L3 x 5 min UE/LE      Knee/Hip Exercises: Standing   Heel Raises Both;10 reps;2 sets    Heel Raises Limitations with UE support   on Airex   Hip Flexion Limitations marches x 20 reps on Airex at counter with UE support    Hip Abduction AROM;Stengthening;Right;Left;2 sets;15 reps;Knee straight    Abduction Limitations red Theraband    Hip Extension Stengthening;Right;Left;2 sets;10 reps    Extension Limitations at counter with UE support as needed      Knee/Hip Exercises: Seated   Sit to [employer]  10 reps;without UE support   from high low table set at 24 in. height     Shoulder Exercises: Seated   Row AROM;Strengthening;Both;15 reps    Theraband Level (Shoulder Row) Level 2 (Red)    Other Seated Exercises alternating Theraband "punch" red band x 15 reps                  PT Education - 10/26/20 1801    Education Details delayed onset muscle soreness, exercises    Person(s) Educated Patient    Methods Explanation    Comprehension Verbalized understanding            PT Short Term Goals - 10/05/20 1547      PT SHORT TERM GOAL #1   Title Independent with initial HEP    Baseline instructed at eval    Time 3    Period Weeks    Status New      PT SHORT TERM GOAL #2   Title Assess Berg to establish LTG for balance/fall risk by visit 3    Time 3    Period Weeks    Status New             PT Long Term  Goals - 10/26/20 1811      PT LONG TERM GOAL #1   Title Improve 5 times sit<>stand time to 15 sec or less to improve ability for transfers from low seats    Baseline 23.85 seconds    Time 6    Period Weeks    Status On-going      PT LONG TERM GOAL #2   Title Improve 2 minute walk test distance to 185 feet or greater to improve ability to tolerance ambulation for activities such as grocery shopping    Baseline 141 feet    Time 6    Period Weeks    Status On-going      PT LONG TERM GOAL #3   Title Achieve bilateral knee strength of 5/5 and increase bilat. hip strength grossly 1/2 MMT grade to improve ability to navigate stairs to home entrance and perform transfers from low seats    Baseline ongoing    Time 6    Period Weeks    Status On-going      PT LONG TERM GOAL #4   Title Improve FOTO outcome measure score to 38% or less limitation    Baseline 48% limited    Time 6    Period Weeks    Status On-going      PT LONG TERM GOAL #5   Title Improve Berg Balance score at least 3-5 points for improved balance to work towards decreased fall risk    Baseline 44/56    Time 6    Period Weeks    Status Revised    Target Date 11/16/20                 Plan - 10/26/20 1802    Clinical Impression Statement Pt. using AD as recommended for ambulation so improving safety with gait vs. ambulation without AD given fall risk. Continued work on general strengthening and balance to improve activity tolerance and safety with mobility. Pt. needed seated rest breaks several times during standing exercises and had some brief hand discomfort with holding onto sink for balance support (showed modified grip variations to minimize discomfort). She has some ongoing right knee discomfort also with underlying OA but was able to tolerate exercises as noted per flowsheet with  minimal limitation from knee. Given symptom etiology expect gradual progress but pt. motivated to participate in therapy so expect  progress with continued therapy.    Personal Factors and Comorbidities Comorbidity 2;Age;Time since onset of injury/illness/exacerbation    Examination-Activity Limitations Transfers;Lift;Squat;Stairs;Locomotion Level;Stand    Examination-Participation Restrictions Meal Prep;Community Activity;Cleaning;Shop    Stability/Clinical Decision Making Evolving/Moderate complexity    Clinical Decision Making Moderate    Rehab Potential Good    PT Frequency 2x / week    PT Duration 6 weeks    PT Treatment/Interventions ADLs/Self Care Home Management;Cryotherapy;Electrical Stimulation;Moist Heat;Gait training;Stair training;Balance training;Functional mobility training;Neuromuscular re-education;Therapeutic activities;Therapeutic exercise;Patient/family education;Manual techniques    PT Next Visit Plan Continue work on general strengthening and conditioning, balance and propriocpetive challenges, pt. safety education, gait training prn    PT Home Exercise Plan Access code: [email or web address]    Consulted and Agree with Plan of Care Patient           Patient will benefit from skilled therapeutic intervention in order to improve the following deficits and impairments:  Pain,Decreased balance,Difficulty walking,Decreased endurance,Decreased strength,Decreased activity tolerance,Cardiopulmonary status limiting activity  Visit Diagnosis: Muscle weakness (generalized)  Difficulty in walking, not elsewhere classified     Problem List Patient Active Problem List   Diagnosis Date Noted   Lumbosacral radiculopathy 06/21/2016   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   Unsteady gait 02/29/2016   Nausea alone 02/05/2013   Hypertension 02/03/2013   Left hand pain 02/03/2013   Cough 02/03/2013    Beaulah Dinning, PT, DPT 10/26/20 6:13 PM  [facility] [address] [place], [place], [address] Phone:  [phone]   Fax:  [phone]  Name: Nancy Maddox MRN: [id number] Date of Birth: Feb 23, 1946

## 2020-10-31 ENCOUNTER — Encounter: Payer: Self-pay | Admitting: Physical Therapy

## 2020-10-31 ENCOUNTER — Ambulatory Visit: Payer: Medicare HMO | Attending: Internal Medicine | Admitting: Physical Therapy

## 2020-10-31 ENCOUNTER — Other Ambulatory Visit: Payer: Self-pay

## 2020-10-31 DIAGNOSIS — R262 Difficulty in walking, not elsewhere classified: Secondary | ICD-10-CM | POA: Insufficient documentation

## 2020-10-31 DIAGNOSIS — M6281 Muscle weakness (generalized): Secondary | ICD-10-CM | POA: Insufficient documentation

## 2020-10-31 NOTE — Patient Instructions (Signed)
      NOSE TO WALL EXERCISE Face the wall about 1 foot away, Sway forward without your heels leaving the ground. Return to upright posture. Do not bend at waist . Sway forward and back 20 x 2   Voncille Lo, PT, [facility] Certified Exercise Expert for the Aging Adult  10/31/20 3:45 PM Phone: [phone] Fax: [phone]

## 2020-10-31 NOTE — Therapy (Signed)
[place], [place], [address] Phone: [phone]   Fax:  [phone]  Physical Therapy Treatment  Patient Details  Name: Nancy Maddox MRN: [id number] Date of Birth: 03-19-1946 Referring Provider (PT): Latanya Presser, MD   Encounter Date: 10/31/2020   PT End of Session - 10/31/20 1513    Visit Number 4    Number of Visits 12    Date for PT Re-Evaluation 11/16/20    Authorization Type [employer] MCR    Authorization Time Period 10/05/20-12/01/20    Authorization - Visit Number 4    Authorization - Number of Visits 12    PT Start Time 1501    PT Stop Time 1545    PT Time Calculation (min) 44 min    Activity Tolerance Patient limited by fatigue    Behavior During Therapy [facility] for tasks assessed/performed           Past Medical History:  Diagnosis Date  . Anemia   . Arthritis   . Complication of anesthesia    patient states that she was given too much anesthesia, too quickly prior to a C-section and could not see for 48 hours  . GERD (gastroesophageal reflux disease)   . Headache   . History of hiatal hernia   . Hypertension   . Pneumonia   . PONV (postoperative nausea and vomiting)   . Prediabetes   . Pulmonary edema    in setting of pregnancy '78    Past Surgical History:  Procedure Laterality Date  . BREAST EXCISIONAL BIOPSY Right   . CARPAL TUNNEL RELEASE  05/2017  . CESAREAN SECTION    . CHOLECYSTECTOMY N/A 11/03/2017   Procedure: LAPAROSCOPIC CHOLECYSTECTOMY WITH ATTEMPTED INTRAOPERATIVE CHOLANGIOGRAM;  Surgeon: Georganna Skeans, MD;  Location: [facility];  Service: General;  Laterality: N/A;  . COLONOSCOPY WITH ESOPHAGOGASTRODUODENOSCOPY (EGD)    . FEMUR FRACTURE SURGERY    . UTERINE FIBROID SURGERY      There were no vitals filed for this visit.   Subjective Assessment - 10/31/20 1507    Subjective I am a little sore today but I am doing fine  especially right in my Right knee and my hand     Pertinent History arthritis with knee pain rigt>left, HTN, prediabetic, history of pulmonary edema    Limitations Walking;Standing;House hold activities    Currently in Pain? Yes    Pain Score 2     Pain Location Back    Pain Orientation Right;Left    Pain Descriptors / Indicators Aching    Multiple Pain Sites Yes    Pain Score 2    Pain Location Knee    Pain Orientation Right    Pain Descriptors / Indicators Aching                             [facility] Adult PT Treatment/Exercise - 10/31/20 0001      Knee/Hip Exercises: Aerobic   Nustep L4 x 5 min UE/LE      Knee/Hip Exercises: Standing   Heel Raises 10 reps;2 sets    Heel Raises Limitations 1 x 10 on floor, 1 x 10 on airex    Hip Flexion Limitations marches x 20 reps on Airex at counter with UE support    Hip Abduction AROM;Stengthening;Right;Left;2 sets;10 reps    Abduction Limitations red t band    Hip Extension Stengthening;AAROM;Right;Left;2 sets;10 reps    Extension Limitations red  t band at counter    Other Standing Knee Exercises staniding with eccentric soleus engaged with nose to wall exercises 2 x 20    Other Standing Knee Exercises sit to stand with 4 lb weight at mat 1 x 10 and 1 x 6 before fatigue standing with  bil chairs for support and 1/2 lunging 2 x 10 with 2 minute breat then on L lunging with airex for support for occassional knee to airex as pt comfort and pain allows.      Shoulder Exercises: Seated   Other Seated Exercises alternating Theraband "punch" red band x 15 reps      Shoulder Exercises: Standing   Other Standing Exercises wall slides for shoulders                  PT Education - 10/31/20 1545    Education Details added resisted hip abd, flex and ext , practiced lunge on airex to floor and nose to wall eccentric solleus    Person(s) Educated Patient    Methods Explanation;Demonstration;Tactile cues;Verbal cues;Handout    Comprehension Verbalized understanding;Returned  demonstration            PT Short Term Goals - 10/05/20 1547      PT SHORT TERM GOAL #1   Title Independent with initial HEP    Baseline instructed at eval    Time 3    Period Weeks    Status New      PT SHORT TERM GOAL #2   Title Assess Berg to establish LTG for balance/fall risk by visit 3    Time 3    Period Weeks    Status New             PT Long Term Goals - 10/26/20 1811      PT LONG TERM GOAL #1   Title Improve 5 times sit<>stand time to 15 sec or less to improve ability for transfers from low seats    Baseline 23.85 seconds    Time 6    Period Weeks    Status On-going      PT LONG TERM GOAL #2   Title Improve 2 minute walk test distance to 185 feet or greater to improve ability to tolerance ambulation for activities such as grocery shopping    Baseline 141 feet    Time 6    Period Weeks    Status On-going      PT LONG TERM GOAL #3   Title Achieve bilateral knee strength of 5/5 and increase bilat. hip strength grossly 1/2 MMT grade to improve ability to navigate stairs to home entrance and perform transfers from low seats    Baseline ongoing    Time 6    Period Weeks    Status On-going      PT LONG TERM GOAL #4   Title Improve FOTO outcome measure score to 38% or less limitation    Baseline 48% limited    Time 6    Period Weeks    Status On-going      PT LONG TERM GOAL #5   Title Improve Berg Balance score at least 3-5 points for improved balance to work towards decreased fall risk    Baseline 44/56    Time 6    Period Weeks    Status Revised    Target Date 11/16/20                 Plan - 10/31/20 1508  Clinical Impression Statement Pt using AD but showing improved sit to stand and carrying 4lb weight.  Pt now strengthening with added resistance to improve balance and fall risk.  Pt also engaging soleus with exercise and begain lunging activity in preparationfor floor to stand mat transfers to decrease fear of falling and increase  strength.  Pt does state she has pain with R knee but did not complain when progressing with lunge activiites.  Will continue to challenge with added resistance and strength as able and as wrist tolerate.  May need to modify for comfort    Personal Factors and Comorbidities Comorbidity 2;Age;Time since onset of injury/illness/exacerbation    Examination-Activity Limitations Transfers;Lift;Squat;Stairs;Locomotion Level;Stand    Examination-Participation Restrictions Meal Prep;Community Activity;Cleaning;Shop    PT Frequency 2x / week    PT Duration 6 weeks    PT Treatment/Interventions ADLs/Self Care Home Management;Cryotherapy;Electrical Stimulation;Moist Heat;Gait training;Stair training;Balance training;Functional mobility training;Neuromuscular re-education;Therapeutic activities;Therapeutic exercise;Patient/family education;Manual techniques    PT Next Visit Plan Continue work on general strengthening and conditioning, balance and propriocpetive challenges, pt. safety education, gait training prn lunging and prep for floor to stand    PT Home Exercise Plan Access code: [email or web address]           Patient will benefit from skilled therapeutic intervention in order to improve the following deficits and impairments:  Pain,Decreased balance,Difficulty walking,Decreased endurance,Decreased strength,Decreased activity tolerance,Cardiopulmonary status limiting activity  Visit Diagnosis: Muscle weakness (generalized)  Difficulty in walking, not elsewhere classified Modified and added to HEP Access Code: [email or web address]: [email or web address]: 02/01/2022Prepared by: Donnetta Simpers BeardsleyExercises   Standing Hip Flexion with Resistance Loop - 1 x daily - 7 x weekly - 2 sets - 10 reps  Standing Hip Abduction with Resistance at Ankles and Counter Support - 1 x daily - 7 x weekly - 2 sets - 10 reps  Standing Hip Extension with Resistance at Ankles and Counter Support - 1 x daily - 7 x weekly - 2 sets  - 10 reps  nose to wall eccentric soleus exercise  2 x 20   Problem List Patient Active Problem List   Diagnosis Date Noted  . Lumbosacral radiculopathy 06/21/2016  . 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 Unsteady gait 02/29/2016  . Nausea alone 02/05/2013  . Hypertension 02/03/2013  . Left hand pain 02/03/2013  . Cough 02/03/2013   Voncille Lo, PT, [place] Certified Exercise Expert for the Aging Adult  10/31/20 5:23 PM Phone: [phone] Fax: [phone]  [facility] [address] [place], [place], [address] Phone: [phone]   Fax:  [phone]  Name: Nancy Maddox MRN: [id number] Date of Birth: 1946-05-28

## 2020-11-02 ENCOUNTER — Other Ambulatory Visit: Payer: Self-pay

## 2020-11-02 ENCOUNTER — Encounter: Payer: Self-pay | Admitting: Physical Therapy

## 2020-11-02 ENCOUNTER — Ambulatory Visit: Payer: Medicare HMO | Admitting: Physical Therapy

## 2020-11-02 DIAGNOSIS — R262 Difficulty in walking, not elsewhere classified: Secondary | ICD-10-CM

## 2020-11-02 DIAGNOSIS — M6281 Muscle weakness (generalized): Secondary | ICD-10-CM

## 2020-11-02 NOTE — Therapy (Signed)
[place], [place], [address] Phone: [phone]   Fax:  [phone]  Physical Therapy Treatment  Patient Details  Name: Nancy Maddox MRN: [id number] Date of Birth: 25-Sep-1946 Referring Provider (PT): Latanya Presser, MD   Encounter Date: 11/02/2020   PT End of Session - 11/02/20 1653    Visit Number 5    Number of Visits 12    Date for PT Re-Evaluation 11/16/20    Authorization Type [employer] MCR    Authorization Time Period 10/05/20-12/01/20    Authorization - Visit Number 5    Authorization - Number of Visits 12    PT Start Time 5427   pt. arrived a few minutes late   PT Stop Time 1547    PT Time Calculation (min) 38 min    Activity Tolerance Patient limited by pain    Behavior During Therapy [facility] for tasks assessed/performed           Past Medical History:  Diagnosis Date  . Anemia   . Arthritis   . Complication of anesthesia    patient states that she was given too much anesthesia, too quickly prior to a C-section and could not see for 48 hours  . GERD (gastroesophageal reflux disease)   . Headache   . History of hiatal hernia   . Hypertension   . Pneumonia   . PONV (postoperative nausea and vomiting)   . Prediabetes   . Pulmonary edema    in setting of pregnancy '78    Past Surgical History:  Procedure Laterality Date  . BREAST EXCISIONAL BIOPSY Right   . CARPAL TUNNEL RELEASE  05/2017  . CESAREAN SECTION    . CHOLECYSTECTOMY N/A 11/03/2017   Procedure: LAPAROSCOPIC CHOLECYSTECTOMY WITH ATTEMPTED INTRAOPERATIVE CHOLANGIOGRAM;  Surgeon: Georganna Skeans, MD;  Location: [facility];  Service: General;  Laterality: N/A;  . COLONOSCOPY WITH ESOPHAGOGASTRODUODENOSCOPY (EGD)    . FEMUR FRACTURE SURGERY    . UTERINE FIBROID SURGERY      There were no vitals filed for this visit.   Subjective Assessment - 11/02/20 1511    Subjective Pt. reports sore after last visit and having some right knee pain  today otherwise no new complaints or concerns since last visit.    Pertinent History arthritis with knee pain rigt>left, HTN, prediabetic, history of pulmonary edema    Limitations Walking;Standing;House hold activities    Patient Stated Goals Improve walking/ability for activity    Currently in Pain? Yes    Pain Score 5     Pain Location Knee    Pain Orientation Right    Pain Descriptors / Indicators Aching    Pain Type Chronic pain    Pain Onset More than a month ago    Pain Frequency Intermittent    Aggravating Factors  walking    Pain Relieving Factors rest    Effect of Pain on Daily Activities limits walking tolerance                             [facility] Adult PT Treatment/Exercise - 11/02/20 0001      Knee/Hip Exercises: Aerobic   Nustep L4 x 60min UE/LE      Knee/Hip Exercises: Standing   Heel Raises 10 reps;2 sets    Heel Raises Limitations on Airex    Hip Flexion Limitations marches x 20 reps on Airex at counter with UE support    Forward Lunges  Right;Left;2 sets;5 sets    Forward Lunges Limitations partial/mini lunge with bilat. UE support between counter and chair    Hip Abduction AROM;Stengthening;Right;Left;2 sets;10 reps    Abduction Limitations red t band    Forward Step Up Right;Left;1 set;10 reps;Hand Hold: 2;Step Height: 4"    Rocker Board 1 minute    Rocker Board Limitations dynamic lateral balance    Other Standing Knee Exercises Romberg in partial tandem stance 20 sec x 3 feet apart on Airex      Knee/Hip Exercises: Seated   Long Arc Quad AROM;Strengthening;Right;Left;2 sets;10 reps    Long Arc Quad Weight 4 lbs.   bilat.   Sit to Sand 2 sets;5 reps   holding 5 lb. KB with bilat. UE                   PT Short Term Goals - 10/05/20 1547      PT SHORT TERM GOAL #1   Title Independent with initial HEP    Baseline instructed at eval    Time 3    Period Weeks    Status New      PT SHORT TERM GOAL #2   Title Assess Berg to  establish LTG for balance/fall risk by visit 3    Time 3    Period Weeks    Status New             PT Long Term Goals - 10/26/20 1811      PT LONG TERM GOAL #1   Title Improve 5 times sit<>stand time to 15 sec or less to improve ability for transfers from low seats    Baseline 23.85 seconds    Time 6    Period Weeks    Status On-going      PT LONG TERM GOAL #2   Title Improve 2 minute walk test distance to 185 feet or greater to improve ability to tolerance ambulation for activities such as grocery shopping    Baseline 141 feet    Time 6    Period Weeks    Status On-going      PT LONG TERM GOAL #3   Title Achieve bilateral knee strength of 5/5 and increase bilat. hip strength grossly 1/2 MMT grade to improve ability to navigate stairs to home entrance and perform transfers from low seats    Baseline ongoing    Time 6    Period Weeks    Status On-going      PT LONG TERM GOAL #4   Title Improve FOTO outcome measure score to 38% or less limitation    Baseline 48% limited    Time 6    Period Weeks    Status On-going      PT LONG TERM GOAL #5   Title Improve Berg Balance score at least 3-5 points for improved balance to work towards decreased fall risk    Baseline 44/56    Time 6    Period Weeks    Status Revised    Target Date 11/16/20                 Plan - 11/02/20 1655    Clinical Impression Statement Continued exercise progession as noted per flowsheet. Pt. noted hand soreness intermittently with UE use for support during standing strengthening and balance exercises so cueing for more broad use of palms for support to minimize finger pain with gripping. Some right knee soreness intermittently but able to tolerated closed chain  strengthening as noted per flowsheet. Still with diffuse muscle weakness and deconditioning so plan continue PT to address functional limitations and improve safety with mobility.    Personal Factors and Comorbidities Comorbidity  2;Age;Time since onset of injury/illness/exacerbation    Examination-Activity Limitations Transfers;Lift;Squat;Stairs;Locomotion Level;Stand    Examination-Participation Restrictions Meal Prep;Community Activity;Cleaning;Shop    Stability/Clinical Decision Making Evolving/Moderate complexity    Clinical Decision Making Moderate    Rehab Potential Good    PT Frequency 2x / week    PT Duration 6 weeks    PT Treatment/Interventions ADLs/Self Care Home Management;Cryotherapy;Electrical Stimulation;Moist Heat;Gait training;Stair training;Balance training;Functional mobility training;Neuromuscular re-education;Therapeutic activities;Therapeutic exercise;Patient/family education;Manual techniques    PT Next Visit Plan Continue work on general strengthening and conditioning, balance and propriocpetive challenges, pt. safety education, gait training prn lunging and prep for floor to stand    PT Home Exercise Plan Access code: [email or web address]    Consulted and Agree with Plan of Care Patient           Patient will benefit from skilled therapeutic intervention in order to improve the following deficits and impairments:  Pain,Decreased balance,Difficulty walking,Decreased endurance,Decreased strength,Decreased activity tolerance,Cardiopulmonary status limiting activity  Visit Diagnosis: Muscle weakness (generalized)  Difficulty in walking, not elsewhere classified     Problem List Patient Active Problem List   Diagnosis Date Noted  . Lumbosacral radiculopathy 06/21/2016  . 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 Unsteady gait 02/29/2016  . Nausea alone 02/05/2013  . Hypertension 02/03/2013  . Left hand pain 02/03/2013  . Cough 02/03/2013    Beaulah Dinning, PT, DPT 11/02/20 4:59 PM  [facility] [address] [place], [place], [address] Phone: [phone]   Fax:  [phone]  Name: Nancy Maddox MRN: [id number] Date of Birth: 09-14-1946

## 2020-11-07 ENCOUNTER — Ambulatory Visit: Payer: Medicare HMO | Admitting: Physical Therapy

## 2020-11-07 ENCOUNTER — Other Ambulatory Visit: Payer: Self-pay

## 2020-11-07 ENCOUNTER — Encounter: Payer: Self-pay | Admitting: Physical Therapy

## 2020-11-07 DIAGNOSIS — M6281 Muscle weakness (generalized): Secondary | ICD-10-CM | POA: Diagnosis not present

## 2020-11-07 DIAGNOSIS — R262 Difficulty in walking, not elsewhere classified: Secondary | ICD-10-CM

## 2020-11-07 NOTE — Therapy (Signed)
[place], [place], [address] Phone: [phone]   Fax:  [phone]  Physical Therapy Treatment  Patient Details  Name: Nancy Maddox MRN: [id number] Date of Birth: 02/01/1946 Referring Provider (PT): Latanya Presser, MD   Encounter Date: 11/07/2020   PT End of Session - 11/07/20 1514    Visit Number 6    Number of Visits 12    Date for PT Re-Evaluation 11/16/20    Authorization Type [employer] MCR    Authorization Time Period 10/05/20-12/01/20    PT Start Time 1515    PT Stop Time 1545    PT Time Calculation (min) 30 min    Activity Tolerance Patient tolerated treatment well    Behavior During Therapy [facility] for tasks assessed/performed           Past Medical History:  Diagnosis Date  . Anemia   . Arthritis   . Complication of anesthesia    patient states that she was given too much anesthesia, too quickly prior to a C-section and could not see for 48 hours  . GERD (gastroesophageal reflux disease)   . Headache   . History of hiatal hernia   . Hypertension   . Pneumonia   . PONV (postoperative nausea and vomiting)   . Prediabetes   . Pulmonary edema    in setting of pregnancy '78    Past Surgical History:  Procedure Laterality Date  . BREAST EXCISIONAL BIOPSY Right   . CARPAL TUNNEL RELEASE  05/2017  . CESAREAN SECTION    . CHOLECYSTECTOMY N/A 11/03/2017   Procedure: LAPAROSCOPIC CHOLECYSTECTOMY WITH ATTEMPTED INTRAOPERATIVE CHOLANGIOGRAM;  Surgeon: Georganna Skeans, MD;  Location: [place];  Service: General;  Laterality: N/A;  . COLONOSCOPY WITH ESOPHAGOGASTRODUODENOSCOPY (EGD)    . FEMUR FRACTURE SURGERY    . UTERINE FIBROID SURGERY      There were no vitals filed for this visit.   Subjective Assessment - 11/07/20 1517    Subjective I used have to take a break in my exercises and now I can do them all at once.  I wake up and do my exercise  I do squat during the day. I wear a pain patch so I  dont hurt as much    Pertinent History arthritis with knee pain rigt>left, HTN, prediabetic, history of pulmonary edema    Limitations Walking;Standing;House hold activities    Patient Stated Goals Improve walking/ability for activity    Currently in Pain? Yes    Pain Score 4     Pain Location Knee    Pain Orientation Right    Pain Descriptors / Indicators Aching    Pain Type Chronic pain    Pain Onset More than a month ago    Pain Frequency Intermittent              [facility] PT Assessment - 11/07/20 0001      Observation/Other Assessments   Focus on Therapeutic Outcomes (FOTO)  Intake eval 52%  11-07-20 40%                         [facility] Adult PT Treatment/Exercise - 11/07/20 0001      Therapeutic Activites    Therapeutic Activities Other Therapeutic Activities    Other Therapeutic Activities lunge to floor using bil UE on chair and mat      Knee/Hip Exercises: Standing   Heel Raises 10 reps;2 sets    Heel Raises  Limitations on Airex    Forward Step Up Right;Left;1 set;10 reps;Hand Hold: 2;Step Height: 4"    Other Standing Knee Exercises 2 MWt 247 feet    Other Standing Knee Exercises lunge x 5 on R and 5 x on L with bil UE support ( mat and chair  lunge on airex                  PT Education - 11/07/20 1654    Education Details educated and demo fall preparedness and floor to mat transfers  and lunges    Person(s) Educated Patient    Methods Explanation;Demonstration;Tactile cues;Verbal cues    Comprehension Verbalized understanding;Returned demonstration            PT Short Term Goals - 11/07/20 1525      PT SHORT TERM GOAL #1   Title Independent with initial HEP    Baseline instructed at eval    Time 3    Period Weeks    Status Achieved      PT SHORT TERM GOAL #2   Title Assess Berg to establish LTG for balance/fall risk by visit 3    Time 3    Period Weeks    Status Achieved             PT Long Term Goals - 11/07/20 1526       PT LONG TERM GOAL #1   Title Improve 5 times sit<>stand time to 15 sec or less to improve ability for transfers from low seats    Baseline 20.03 sec    Time 6    Period Weeks    Status On-going      PT LONG TERM GOAL #2   Title Improve 2 minute walk test distance to 185 feet or greater to improve ability to tolerance ambulation for activities such as grocery shopping    Baseline 257 feet in 2 min 11-07-20    Time 6    Period Weeks    Status Achieved      PT LONG TERM GOAL #3   Title Achieve bilateral knee strength of 5/5 and increase bilat. hip strength grossly 1/2 MMT grade to improve ability to navigate stairs to home entrance and perform transfers from low seats    Baseline ongoing    Time 6    Period Weeks    Status On-going      PT LONG TERM GOAL #4   Title Improve FOTO outcome measure score to 38% or less limitation    Baseline 48% limted today 60 % limited  not consistent with progress    Time 6    Period Weeks    Status On-going      PT LONG TERM GOAL #5   Title Improve Berg Balance score at least 3-5 points for improved balance to work towards decreased fall risk    Baseline 44/56    Time 6    Period Weeks    Status Revised                 Plan - 11/07/20 1516    Clinical Impression Statement Pt enters clinic 15 minutes late but is able to perform 2 min walk test for 247Ft much improved from eval.   5 x STS 20.03 sec improved from eval    FOTO score did not improve but not consistent with functional improvement with squating, lunge and mat to floor xfer ability.  Pt did complain of knee soreness and adjusted  for the tasks completed, but pt was able to accomplish more feet in 2 MWT and improved 5 x STS.  Pt was apprehensive initially to perfor floor to mat x fer but was able to perform with supervision and needed no assistance to rise from floor but consistent verbal cuing necessary for placement of feet and hands.  Will continue POC and begin using added weights  with squats.    Personal Factors and Comorbidities Comorbidity 2;Age;Time since onset of injury/illness/exacerbation    Examination-Activity Limitations Transfers;Lift;Squat;Stairs;Locomotion Level;Stand    Examination-Participation Restrictions Meal Prep;Community Activity;Cleaning;Shop    PT Duration 6 weeks    PT Treatment/Interventions ADLs/Self Care Home Management;Cryotherapy;Electrical Stimulation;Moist Heat;Gait training;Stair training;Balance training;Functional mobility training;Neuromuscular re-education;Therapeutic activities;Therapeutic exercise;Patient/family education;Manual techniques    PT Next Visit Plan Continue work on general strengthening and conditioning, balance and propriocpetive challenges, pt. safety education, gait training prn lunging and prep for floor to stand, added weights to squats    PT Home Exercise Plan Access code: [email or web address]    Consulted and Agree with Plan of Care Patient           Patient will benefit from skilled therapeutic intervention in order to improve the following deficits and impairments:  Pain,Decreased balance,Difficulty walking,Decreased endurance,Decreased strength,Decreased activity tolerance,Cardiopulmonary status limiting activity  Visit Diagnosis: Muscle weakness (generalized)  Difficulty in walking, not elsewhere classified     Problem List Patient Active Problem List   Diagnosis Date Noted  . Lumbosacral radiculopathy 06/21/2016  . 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 Unsteady gait 02/29/2016  . Nausea alone 02/05/2013  . Hypertension 02/03/2013  . Left hand pain 02/03/2013  . Cough 02/03/2013    Voncille Lo, PT, [place] Certified Exercise Expert for the Aging Adult  11/07/20 5:11 PM Phone: [phone] Fax: [place] [facility] [address] [place], [place], [address] Phone: [phone]   Fax:   [phone]  Name: Nancy Maddox MRN: [id number] Date of Birth: [birth year]

## 2020-11-09 ENCOUNTER — Encounter: Payer: Self-pay | Admitting: Physical Therapy

## 2020-11-09 ENCOUNTER — Other Ambulatory Visit: Payer: Self-pay

## 2020-11-09 ENCOUNTER — Ambulatory Visit: Payer: Medicare HMO | Admitting: Physical Therapy

## 2020-11-09 DIAGNOSIS — R262 Difficulty in walking, not elsewhere classified: Secondary | ICD-10-CM

## 2020-11-09 DIAGNOSIS — M6281 Muscle weakness (generalized): Secondary | ICD-10-CM | POA: Diagnosis not present

## 2020-11-09 NOTE — Therapy (Signed)
[place], [place], [address] Phone: [phone]   Fax:  [phone]  Physical Therapy Treatment  Patient Details  Name: Nancy Maddox MRN: [id number] Date of Birth: May 24, 1946 Referring Provider (PT): Latanya Presser, MD   Encounter Date: 11/09/2020   PT End of Session - 11/09/20 1557    Visit Number 7    Number of Visits 12    Date for PT Re-Evaluation 11/16/20    Authorization Type [employer] MCR    Authorization Time Period 10/05/20-12/01/20    Authorization - Visit Number 6    Authorization - Number of Visits 12    PT Start Time 8786   10 min late to start tx. due to pt. arriving a few minutes late and needing to use restroom before starting session   PT Stop Time 1630    PT Time Calculation (min) 35 min    Activity Tolerance Patient limited by fatigue    Behavior During Therapy [facility] for tasks assessed/performed           Past Medical History:  Diagnosis Date  . Anemia   . Arthritis   . Complication of anesthesia    patient states that she was given too much anesthesia, too quickly prior to a C-section and could not see for 48 hours  . GERD (gastroesophageal reflux disease)   . Headache   . History of hiatal hernia   . Hypertension   . Pneumonia   . PONV (postoperative nausea and vomiting)   . Prediabetes   . Pulmonary edema    in setting of pregnancy '78    Past Surgical History:  Procedure Laterality Date  . BREAST EXCISIONAL BIOPSY Right   . CARPAL TUNNEL RELEASE  05/2017  . CESAREAN SECTION    . CHOLECYSTECTOMY N/A 11/03/2017   Procedure: LAPAROSCOPIC CHOLECYSTECTOMY WITH ATTEMPTED INTRAOPERATIVE CHOLANGIOGRAM;  Surgeon: Georganna Skeans, MD;  Location: [place];  Service: General;  Laterality: N/A;  . COLONOSCOPY WITH ESOPHAGOGASTRODUODENOSCOPY (EGD)    . FEMUR FRACTURE SURGERY    . UTERINE FIBROID SURGERY      There were no vitals filed for this visit.   Subjective Assessment -  11/09/20 1558    Subjective Pt. reports had soreness in her legs, left "side" and legs after last session but (soreness) since improving. No new complaints or concerns otherwise this PM. She reports practicing getting up from a kneeling position at home (practiced this AM before PT session).    Pertinent History arthritis with knee pain right>left, HTN, prediabetic, history of pulmonary edema                             [facility] Adult PT Treatment/Exercise - 11/09/20 0001      Knee/Hip Exercises: Aerobic   Nustep L5 x 5 min UE/LE      Knee/Hip Exercises: Standing   Heel Raises 10 reps;2 sets    Heel Raises Limitations on Airex    Hip Abduction AROM;Stengthening;Right;Left;2 sets;10 reps    Abduction Limitations 2 lbs.    Forward Step Up Right;Left;1 set;10 reps;Hand Hold: 2;Step Height: 4"    Rocker Board 2 minutes    Rocker Board Limitations 1 min ea. dynamic balance lateral and fw/rev    Other Standing Knee Exercises Romberg feet 3-4 in. apart eyes closed with CGA on Airex 20 sec x 3    Other Standing Knee Exercises lunge x 5 reps ea. side  to mat with double pillows for knee touch bilat. UE support on chair with CGA for safety      Knee/Hip Exercises: Seated   Long Arc Quad AROM;Strengthening;Left;Right;2 sets;10 reps    Long Arc Quad Weight 5 lbs.    Long [employer] Limitations performed for strengthening due to pt. fatigue from standing exercises                    PT Short Term Goals - 11/07/20 1525      PT SHORT TERM GOAL #1   Title Independent with initial HEP    Baseline instructed at eval    Time 3    Period Weeks    Status Achieved      PT SHORT TERM GOAL #2   Title Assess Berg to establish LTG for balance/fall risk by visit 3    Time 3    Period Weeks    Status Achieved             PT Long Term Goals - 11/07/20 1526      PT LONG TERM GOAL #1   Title Improve 5 times sit<>stand time to 15 sec or less to improve ability for transfers  from low seats    Baseline 20.03 sec    Time 6    Period Weeks    Status On-going      PT LONG TERM GOAL #2   Title Improve 2 minute walk test distance to 185 feet or greater to improve ability to tolerance ambulation for activities such as grocery shopping    Baseline 257 feet in 2 min 11-07-20    Time 6    Period Weeks    Status Achieved      PT LONG TERM GOAL #3   Title Achieve bilateral knee strength of 5/5 and increase bilat. hip strength grossly 1/2 MMT grade to improve ability to navigate stairs to home entrance and perform transfers from low seats    Baseline ongoing    Time 6    Period Weeks    Status On-going      PT LONG TERM GOAL #4   Title Improve FOTO outcome measure score to 38% or less limitation    Baseline 48% limted today 60 % limited  not consistent with progress    Time 6    Period Weeks    Status On-going      PT LONG TERM GOAL #5   Title Improve Berg Balance score at least 3-5 points for improved balance to work towards decreased fall risk    Baseline 44/56    Time 6    Period Weeks    Status Revised                 Plan - 11/09/20 1740    Clinical Impression Statement Continued work on strengthening and balance as well as safety with ability for floor transfers with pt. able to demo appropriate technique with min cues for form (used CGA for safety). Activity tolerance still limited due to fatigue with rest breaks required during session and intermittent knee pain exacerbation (right knee) with exercises but low irritability for pain given limited symptom duration.    Personal Factors and Comorbidities Comorbidity 2;Age;Time since onset of injury/illness/exacerbation    Examination-Activity Limitations Transfers;Lift;Squat;Stairs;Locomotion Level;Stand    Examination-Participation Restrictions Meal Prep;Community Activity;Cleaning;Shop    Stability/Clinical Decision Making Evolving/Moderate complexity    Clinical Decision Making Moderate    Rehab  Potential Good  PT Frequency 2x / week    PT Duration 6 weeks    PT Treatment/Interventions ADLs/Self Care Home Management;Cryotherapy;Electrical Stimulation;Moist Heat;Gait training;Stair training;Balance training;Functional mobility training;Neuromuscular re-education;Therapeutic activities;Therapeutic exercise;Patient/family education;Manual techniques    PT Next Visit Plan Continue work on general strengthening and conditioning, balance and propriocpetive challenges, pt. safety education, gait training prn lunging and prep for floor to stand, added weights to squats    PT Home Exercise Plan Access code: [email or web address]    Consulted and Agree with Plan of Care Patient           Patient will benefit from skilled therapeutic intervention in order to improve the following deficits and impairments:  Pain,Decreased balance,Difficulty walking,Decreased endurance,Decreased strength,Decreased activity tolerance,Cardiopulmonary status limiting activity  Visit Diagnosis: Muscle weakness (generalized)  Difficulty in walking, not elsewhere classified     Problem List Patient Active Problem List   Diagnosis Date Noted  . Lumbosacral radiculopathy 06/21/2016  . 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 Unsteady gait 02/29/2016  . Nausea alone 02/05/2013  . Hypertension 02/03/2013  . Left hand pain 02/03/2013  . Cough 02/03/2013    Beaulah Dinning, PT, DPT 11/09/20 5:52 PM  [facility] [address] [place], [place], [address] Phone: [phone]   Fax:  [phone]  Name: Nancy Maddox MRN: [id number] Date of Birth: [birth year]

## 2020-11-09 NOTE — Therapy (Signed)
[place], [place], [address] Phone: [phone]   Fax:  [phone]  Physical Therapy Treatment  Patient Details  Name: Nancy Maddox MRN: [id number] Date of Birth: Feb 16, 1946 Referring Provider (PT): Latanya Presser, MD   Encounter Date: 11/09/2020   PT End of Session - 11/09/20 1557    Visit Number 7    Number of Visits 12    Date for PT Re-Evaluation 11/16/20    Authorization Type [employer] MCR    Authorization Time Period 10/05/20-12/01/20    Authorization - Visit Number 7    Authorization - Number of Visits 12    PT Start Time 9211   10 min late to start tx. due to pt. arriving a few minutes late and needing to use restroom before starting session   PT Stop Time 1630    PT Time Calculation (min) 35 min    Activity Tolerance Patient limited by fatigue    Behavior During Therapy [facility] for tasks assessed/performed           Past Medical History:  Diagnosis Date  . Anemia   . Arthritis   . Complication of anesthesia    patient states that she was given too much anesthesia, too quickly prior to a C-section and could not see for 48 hours  . GERD (gastroesophageal reflux disease)   . Headache   . History of hiatal hernia   . Hypertension   . Pneumonia   . PONV (postoperative nausea and vomiting)   . Prediabetes   . Pulmonary edema    in setting of pregnancy '78    Past Surgical History:  Procedure Laterality Date  . BREAST EXCISIONAL BIOPSY Right   . CARPAL TUNNEL RELEASE  05/2017  . CESAREAN SECTION    . CHOLECYSTECTOMY N/A 11/03/2017   Procedure: LAPAROSCOPIC CHOLECYSTECTOMY WITH ATTEMPTED INTRAOPERATIVE CHOLANGIOGRAM;  Surgeon: Georganna Skeans, MD;  Location: [place];  Service: General;  Laterality: N/A;  . COLONOSCOPY WITH ESOPHAGOGASTRODUODENOSCOPY (EGD)    . FEMUR FRACTURE SURGERY    . UTERINE FIBROID SURGERY      There were no vitals filed for this visit.   Subjective Assessment -  11/09/20 1558    Subjective Pt. reports had soreness in her legs, left "side" and legs after last session but (soreness) since improving. No new complaints or concerns otherwise this PM. She reports practicing getting up from a kneeling position at home (practiced this AM before PT session).    Pertinent History arthritis with knee pain right>left, HTN, prediabetic, history of pulmonary edema                             [facility] Adult PT Treatment/Exercise - 11/09/20 0001      Knee/Hip Exercises: Aerobic   Nustep L5 x 5 min UE/LE      Knee/Hip Exercises: Standing   Heel Raises 10 reps;2 sets    Heel Raises Limitations on Airex    Hip Abduction AROM;Stengthening;Right;Left;2 sets;10 reps    Abduction Limitations 2 lbs.    Forward Step Up Right;Left;1 set;10 reps;Hand Hold: 2;Step Height: 4"    Rocker Board 2 minutes    Rocker Board Limitations 1 min ea. dynamic balance lateral and fw/rev    Other Standing Knee Exercises Romberg feet 3-4 in. apart eyes closed with CGA on Airex 20 sec x 3    Other Standing Knee Exercises lunge x 5 reps ea. side  to mat with double pillows for knee touch bilat. UE support on chair with CGA for safety      Knee/Hip Exercises: Seated   Long Arc Quad AROM;Strengthening;Left;Right;2 sets;10 reps    Long Arc Quad Weight 5 lbs.    Long [employer] Limitations performed for strengthening due to pt. fatigue from standing exercises                    PT Short Term Goals - 11/07/20 1525      PT SHORT TERM GOAL #1   Title Independent with initial HEP    Baseline instructed at eval    Time 3    Period Weeks    Status Achieved      PT SHORT TERM GOAL #2   Title Assess Berg to establish LTG for balance/fall risk by visit 3    Time 3    Period Weeks    Status Achieved             PT Long Term Goals - 11/07/20 1526      PT LONG TERM GOAL #1   Title Improve 5 times sit<>stand time to 15 sec or less to improve ability for transfers  from low seats    Baseline 20.03 sec    Time 6    Period Weeks    Status On-going      PT LONG TERM GOAL #2   Title Improve 2 minute walk test distance to 185 feet or greater to improve ability to tolerance ambulation for activities such as grocery shopping    Baseline 257 feet in 2 min 11-07-20    Time 6    Period Weeks    Status Achieved      PT LONG TERM GOAL #3   Title Achieve bilateral knee strength of 5/5 and increase bilat. hip strength grossly 1/2 MMT grade to improve ability to navigate stairs to home entrance and perform transfers from low seats    Baseline ongoing    Time 6    Period Weeks    Status On-going      PT LONG TERM GOAL #4   Title Improve FOTO outcome measure score to 38% or less limitation    Baseline 48% limted today 60 % limited  not consistent with progress    Time 6    Period Weeks    Status On-going      PT LONG TERM GOAL #5   Title Improve Berg Balance score at least 3-5 points for improved balance to work towards decreased fall risk    Baseline 44/56    Time 6    Period Weeks    Status Revised                 Plan - 11/09/20 1740    Clinical Impression Statement Continued work on strengthening and balance as well as safety with ability for floor transfers with pt. able to demo appropriate technique with min cues for form (used CGA for safety). Activity tolerance still limited due to fatigue with rest breaks required during session and intermittent knee pain exacerbation (right knee) with exercises but low irritability for pain given limited symptom duration.    Personal Factors and Comorbidities Comorbidity 2;Age;Time since onset of injury/illness/exacerbation    Examination-Activity Limitations Transfers;Lift;Squat;Stairs;Locomotion Level;Stand    Examination-Participation Restrictions Meal Prep;Community Activity;Cleaning;Shop    Stability/Clinical Decision Making Evolving/Moderate complexity    Clinical Decision Making Moderate    Rehab  Potential Good  PT Frequency 2x / week    PT Duration 6 weeks    PT Treatment/Interventions ADLs/Self Care Home Management;Cryotherapy;Electrical Stimulation;Moist Heat;Gait training;Stair training;Balance training;Functional mobility training;Neuromuscular re-education;Therapeutic activities;Therapeutic exercise;Patient/family education;Manual techniques    PT Next Visit Plan Continue work on general strengthening and conditioning, balance and propriocpetive challenges, pt. safety education, gait training prn lunging and prep for floor to stand, added weights to squats    PT Home Exercise Plan Access code: [email or web address]    Consulted and Agree with Plan of Care Patient           Patient will benefit from skilled therapeutic intervention in order to improve the following deficits and impairments:  Pain,Decreased balance,Difficulty walking,Decreased endurance,Decreased strength,Decreased activity tolerance,Cardiopulmonary status limiting activity  Visit Diagnosis: Muscle weakness (generalized)  Difficulty in walking, not elsewhere classified     Problem List Patient Active Problem List   Diagnosis Date Noted  . Lumbosacral radiculopathy 06/21/2016  . 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 Unsteady gait 02/29/2016  . Nausea alone 02/05/2013  . Hypertension 02/03/2013  . Left hand pain 02/03/2013  . Cough 02/03/2013    Beaulah Dinning, PT, DPT 11/09/20 5:54 PM  [place] [facility] [address] [place], [place], [address] Phone: [phone]   Fax:  [phone]  Name: Nancy Maddox MRN: [id number] Date of Birth: 05/26/1946

## 2020-11-14 ENCOUNTER — Other Ambulatory Visit: Payer: Self-pay

## 2020-11-14 ENCOUNTER — Ambulatory Visit: Payer: Medicare HMO | Admitting: Rehabilitative and Restorative Service Providers"

## 2020-11-14 ENCOUNTER — Encounter: Payer: Self-pay | Admitting: Rehabilitative and Restorative Service Providers"

## 2020-11-14 DIAGNOSIS — M6281 Muscle weakness (generalized): Secondary | ICD-10-CM

## 2020-11-14 DIAGNOSIS — R262 Difficulty in walking, not elsewhere classified: Secondary | ICD-10-CM

## 2020-11-14 NOTE — Therapy (Signed)
[place], [place], [address] Phone: [phone]   Fax:  [phone]  Physical Therapy Treatment  Patient Details  Name: Nancy Maddox MRN: [id number] Date of Birth: Jun 16, 1946 Referring Provider (PT): Latanya Presser, MD   Encounter Date: 11/14/2020   PT End of Session - 11/14/20 1525    Visit Number 8    Authorization - Visit Number 8    PT Start Time 0310    PT Stop Time 0345    PT Time Calculation (min) 35 min    Activity Tolerance Patient limited by fatigue;Patient tolerated treatment well    Behavior During Therapy [facility] for tasks assessed/performed           Past Medical History:  Diagnosis Date  . Anemia   . Arthritis   . Complication of anesthesia    patient states that she was given too much anesthesia, too quickly prior to a C-section and could not see for 48 hours  . GERD (gastroesophageal reflux disease)   . Headache   . History of hiatal hernia   . Hypertension   . Pneumonia   . PONV (postoperative nausea and vomiting)   . Prediabetes   . Pulmonary edema    in setting of pregnancy '78    Past Surgical History:  Procedure Laterality Date  . BREAST EXCISIONAL BIOPSY Right   . CARPAL TUNNEL RELEASE  05/2017  . CESAREAN SECTION    . CHOLECYSTECTOMY N/A 11/03/2017   Procedure: LAPAROSCOPIC CHOLECYSTECTOMY WITH ATTEMPTED INTRAOPERATIVE CHOLANGIOGRAM;  Surgeon: Georganna Skeans, MD;  Location: [place];  Service: General;  Laterality: N/A;  . COLONOSCOPY WITH ESOPHAGOGASTRODUODENOSCOPY (EGD)    . FEMUR FRACTURE SURGERY    . UTERINE FIBROID SURGERY      There were no vitals filed for this visit.   Subjective Assessment - 11/14/20 1515    Subjective Still soreness in my legs. no change there. pt 10 min late    Currently in Pain? Yes    Pain Score 4     Pain Location Knee    Pain Orientation Right    Pain Descriptors / Indicators Aching    Pain Type Chronic pain    Pain Onset More  than a month ago    Pain Frequency Intermittent    Multiple Pain Sites No                             [facility] Adult PT Treatment/Exercise - 11/14/20 0001      Knee/Hip Exercises: Standing   Other Standing Knee Exercises lunges x 15 each side holding onto countertop with 1 hand; standing double heel raise x 15, double toe raise x 15, standing hip abduct/circle combo clockwise/counterclockwise x 20 each at countertop; standing donkey kick x 20; standing hip abdct/ext combo x 20 at countertop      Knee/Hip Exercises: Seated   Other Seated Knee/Hip Exercises Sit to stands 2x10; seated hip abduct/adduct/flex combo x 15; seated trunk flexion 2x30 sec; iso bil LAQ seated EOB x 10 with 2-3 sec hold; seated iso LAQ with hip abdct/addct combo  with knees ext x 15                    PT Short Term Goals - 11/07/20 1525      PT SHORT TERM GOAL #1   Title Independent with initial HEP    Baseline instructed at eval  Time 3    Period Weeks    Status Achieved      PT SHORT TERM GOAL #2   Title Assess Berg to establish LTG for balance/fall risk by visit 3    Time 3    Period Weeks    Status Achieved             PT Long Term Goals - 11/14/20 1528      PT LONG TERM GOAL #1   Title Improve 5 times sit<>stand time to 15 sec or less to improve ability for transfers from low seats    Status On-going      PT LONG TERM GOAL #2   Title Improve 2 minute walk test distance to 185 feet or greater to improve ability to tolerance ambulation for activities such as grocery shopping    Status Achieved      PT LONG TERM GOAL #3   Title Achieve bilateral knee strength of 5/5 and increase bilat. hip strength grossly 1/2 MMT grade to improve ability to navigate stairs to home entrance and perform transfers from low seats    Status On-going      PT LONG TERM GOAL #4   Title Improve FOTO outcome measure score to 38% or less limitation    Status On-going      PT LONG TERM GOAL  #5   Title Improve Berg Balance score at least 3-5 points for improved balance to work towards decreased fall risk    Status On-going                 Plan - 11/14/20 1525    Clinical Impression Statement Continued work on bil LE strengthening and balance as well as safety with ability for floor transfers. Activity tolerance still limited due to fatigue with standing rest breaks required during session and intermittent knee pain exacerbation (right knee) with exercises but low irritability for pain given limited symptom duration. Pt would benefit from continued therapy to address above deficits to increase mobility, safety and overall function.    Rehab Potential Good    PT Frequency 2x / week    PT Duration 6 weeks    PT Treatment/Interventions ADLs/Self Care Home Management;Cryotherapy;Electrical Stimulation;Moist Heat;Gait training;Stair training;Balance training;Functional mobility training;Neuromuscular re-education;Therapeutic activities;Therapeutic exercise;Patient/family education;Manual techniques    PT Next Visit Plan Continue work on general strengthening and conditioning, balance and propriocpetive challenges, pt. safety education, gait training prn lunging and prep for floor to stand, added weights to squats    Consulted and Agree with Plan of Care Patient           Patient will benefit from skilled therapeutic intervention in order to improve the following deficits and impairments:  Pain,Decreased balance,Difficulty walking,Decreased endurance,Decreased strength,Decreased activity tolerance,Cardiopulmonary status limiting activity  Visit Diagnosis: Muscle weakness (generalized)  Difficulty in walking, not elsewhere classified     Problem List Patient Active Problem List   Diagnosis Date Noted  . Lumbosacral radiculopathy 06/21/2016  . 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 Unsteady gait 02/29/2016  . Nausea  alone 02/05/2013  . Hypertension 02/03/2013  . Left hand pain 02/03/2013  . Cough 02/03/2013    Ayva Veilleux, PT 11/14/2020, 3:47 PM  [facility] [address] [place], [place], [address] Phone: [phone]   Fax:  [phone]  Name: Nancy Maddox MRN: [id number] Date of Birth: [birth year]

## 2020-11-15 ENCOUNTER — Encounter: Payer: Self-pay | Admitting: Gastroenterology

## 2020-11-16 ENCOUNTER — Ambulatory Visit: Payer: Medicare HMO | Admitting: Physical Therapy

## 2020-11-21 ENCOUNTER — Other Ambulatory Visit: Payer: Self-pay

## 2020-11-21 ENCOUNTER — Ambulatory Visit: Payer: Medicare HMO

## 2020-11-21 DIAGNOSIS — M6281 Muscle weakness (generalized): Secondary | ICD-10-CM | POA: Diagnosis not present

## 2020-11-21 DIAGNOSIS — R262 Difficulty in walking, not elsewhere classified: Secondary | ICD-10-CM

## 2020-11-21 NOTE — Therapy (Signed)
[place], [place], [address] Phone: [phone]   Fax:  [phone]  Physical Therapy Treatment  Patient Details  Name: Nancy Maddox MRN: [id number] Date of Birth: [birth year] Referring Provider (PT): Latanya Presser, MD   Encounter Date: 11/21/2020   PT End of Session - 11/21/20 1409    Visit Number 9    Number of Visits 12    Date for PT Re-Evaluation 11/16/20    Authorization Type [employer] MCR    Authorization Time Period 10/05/20-12/01/20    Authorization - Visit Number 9    Authorization - Number of Visits 12    PT Start Time 6948    PT Stop Time 1446    PT Time Calculation (min) 38 min    Activity Tolerance Patient limited by fatigue;Patient tolerated treatment well    Behavior During Therapy [facility] for tasks assessed/performed           Past Medical History:  Diagnosis Date  . Anemia   . Arthritis   . Complication of anesthesia    patient states that she was given too much anesthesia, too quickly prior to a C-section and could not see for 48 hours  . GERD (gastroesophageal reflux disease)   . Headache   . History of hiatal hernia   . Hypertension   . Pneumonia   . PONV (postoperative nausea and vomiting)   . Prediabetes   . Pulmonary edema    in setting of pregnancy '78    Past Surgical History:  Procedure Laterality Date  . BREAST EXCISIONAL BIOPSY Right   . CARPAL TUNNEL RELEASE  05/2017  . CESAREAN SECTION    . CHOLECYSTECTOMY N/A 11/03/2017   Procedure: LAPAROSCOPIC CHOLECYSTECTOMY WITH ATTEMPTED INTRAOPERATIVE CHOLANGIOGRAM;  Surgeon: Georganna Skeans, MD;  Location: [place];  Service: General;  Laterality: N/A;  . COLONOSCOPY WITH ESOPHAGOGASTRODUODENOSCOPY (EGD)    . FEMUR FRACTURE SURGERY    . UTERINE FIBROID SURGERY      There were no vitals filed for this visit.   Subjective Assessment - 11/21/20 1422    Subjective Pt reports her R LE is sore with the knee and foot.     Pertinent History arthritis with knee pain right>left, HTN, prediabetic, history of pulmonary edema    Limitations Walking;Standing;House hold activities    Patient Stated Goals Improve walking/ability for activity    Currently in Pain? Yes    Pain Score 5     Pain Location Knee    Pain Orientation Right    Pain Descriptors / Indicators Aching    Pain Type Chronic pain    Pain Onset More than a month ago    Pain Frequency Intermittent    Aggravating Factors  walking, steps    Pain Relieving Factors rest    Effect of Pain on Daily Activities limits walking tolerance    Pain Score 0    Pain Location Knee    Pain Orientation Left                             [facility] Adult PT Treatment/Exercise - 11/21/20 0001      Ambulation/Gait   Gait Comments stair training with SPC to asc/dsc 3 steps with the L LE leading to asc and R LE lending to dsc.      Knee/Hip Exercises: Aerobic   Nustep L5 x 6 min UE/LE      Knee/Hip Exercises: Seated  Long Arc [employer] Right;Left;10 reps;2 sets    [employer] Weight 3 lbs.    Ball Squeeze 15x; 5 sec    Marching Right;Left;2 sets;20 reps    Marching Limitations Red Tband    Abduction/Adduction  Right;Left;2 sets;10 reps    Abd/Adduction Limitations red Tband    Sit to [employer] 5 reps;2 sets      Knee/Hip Exercises: Supine   Straight Leg Raises Right;Left;2 sets;10 reps   towel roll under R knee     Knee/Hip Exercises: Sidelying   Hip ABduction Right;Left;10 reps                    PT Short Term Goals - 11/07/20 1525      PT SHORT TERM GOAL #1   Title Independent with initial HEP    Baseline instructed at eval    Time 3    Period Weeks    Status Achieved      PT SHORT TERM GOAL #2   Title Assess Berg to establish LTG for balance/fall risk by visit 3    Time 3    Period Weeks    Status Achieved             PT Long Term Goals - 11/14/20 1528      PT LONG TERM GOAL #1   Title Improve 5 times sit<>stand time  to 15 sec or less to improve ability for transfers from low seats    Status On-going      PT LONG TERM GOAL #2   Title Improve 2 minute walk test distance to 185 feet or greater to improve ability to tolerance ambulation for activities such as grocery shopping    Status Achieved      PT LONG TERM GOAL #3   Title Achieve bilateral knee strength of 5/5 and increase bilat. hip strength grossly 1/2 MMT grade to improve ability to navigate stairs to home entrance and perform transfers from low seats    Status On-going      PT LONG TERM GOAL #4   Title Improve FOTO outcome measure score to 38% or less limitation    Status On-going      PT LONG TERM GOAL #5   Title Improve Berg Balance score at least 3-5 points for improved balance to work towards decreased fall risk    Status On-going                 Plan - 11/21/20 1410    Clinical Impression Statement PT was provided for bilat hip and knee strengthening. Pt's R knee was aggrevated with swelling, but pt was able to participate with good effort. Asc/dsc steps c a SPC was reviewed for proper technique to decrease strain on the R knee. Pt completed with proper technique.    Personal Factors and Comorbidities Comorbidity 2;Age;Time since onset of injury/illness/exacerbation    Examination-Activity Limitations Transfers;Lift;Squat;Stairs;Locomotion Level;Stand    Examination-Participation Restrictions Meal Prep;Community Activity;Cleaning;Shop    Stability/Clinical Decision Making Evolving/Moderate complexity    Rehab Potential Good    PT Frequency 2x / week    PT Duration 6 weeks    PT Treatment/Interventions ADLs/Self Care Home Management;Cryotherapy;Electrical Stimulation;Moist Heat;Gait training;Stair training;Balance training;Functional mobility training;Neuromuscular re-education;Therapeutic activities;Therapeutic exercise;Patient/family education;Manual techniques    PT Next Visit Plan Continue work on general strengthening and  conditioning, balance and propriocpetive challenges, pt. safety education, gait training prn lunging and prep for floor to stand, added weights to squats.    PT  Home Exercise Plan Access code: [email or web address]    Consulted and Agree with Plan of Care Patient           Patient will benefit from skilled therapeutic intervention in order to improve the following deficits and impairments:  Pain,Decreased balance,Difficulty walking,Decreased endurance,Decreased strength,Decreased activity tolerance,Cardiopulmonary status limiting activity  Visit Diagnosis: Muscle weakness (generalized)  Difficulty in walking, not elsewhere classified     Problem List Patient Active Problem List   Diagnosis Date Noted  . Lumbosacral radiculopathy 06/21/2016  . 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 Unsteady gait 02/29/2016  . Nausea alone 02/05/2013  . Hypertension 02/03/2013  . Left hand pain 02/03/2013  . Cough 02/03/2013   Gar Ponto MS, PT 11/21/20 7:48 PM  [facility] [address] [place], [place], [address] Phone: [phone]   Fax:  [phone]  Name: PHYLIS JAVED MRN: [id number] Date of Birth: [birth year]

## 2020-11-28 ENCOUNTER — Ambulatory Visit: Payer: Medicare HMO

## 2020-11-28 ENCOUNTER — Other Ambulatory Visit: Payer: Self-pay

## 2020-11-28 ENCOUNTER — Ambulatory Visit: Payer: Medicare HMO | Attending: Internal Medicine

## 2020-11-28 DIAGNOSIS — M6281 Muscle weakness (generalized): Secondary | ICD-10-CM

## 2020-11-28 DIAGNOSIS — R2689 Other abnormalities of gait and mobility: Secondary | ICD-10-CM | POA: Diagnosis present

## 2020-11-28 DIAGNOSIS — R262 Difficulty in walking, not elsewhere classified: Secondary | ICD-10-CM

## 2020-11-28 DIAGNOSIS — G8929 Other chronic pain: Secondary | ICD-10-CM

## 2020-11-28 DIAGNOSIS — M25561 Pain in right knee: Secondary | ICD-10-CM | POA: Insufficient documentation

## 2020-11-28 DIAGNOSIS — M25562 Pain in left knee: Secondary | ICD-10-CM | POA: Insufficient documentation

## 2020-11-29 NOTE — Therapy (Signed)
[place], [place], [address] Phone: [phone]   Fax:  [phone]  Physical Therapy Treatment/Re-Cert/Re-Auth/Progress Note  Patient Details  Name: Nancy Maddox MRN: [id number] Date of Birth: June 26, 1946 Referring Provider (PT): Latanya Presser, MD  Progress Note Reporting Period 10/05/20 to 11/28/20  See note below for Objective Data and Assessment of Progress/Goals.       Encounter Date: 11/28/2020   PT End of Session - 11/28/20 1631    Visit Number 10    Number of Visits 18    Date for PT Re-Evaluation 01/06/21    Authorization Type [employer] MCR    Authorization Time Period 10/05/20-12/01/20    Authorization - Visit Number 10    Authorization - Number of Visits 12    PT Start Time 1625    PT Stop Time 1705    PT Time Calculation (min) 40 min    Activity Tolerance Patient limited by fatigue;Patient tolerated treatment well    Behavior During Therapy [facility] for tasks assessed/performed           Past Medical History:  Diagnosis Date  . Anemia   . Arthritis   . Complication of anesthesia    patient states that she was given too much anesthesia, too quickly prior to a C-section and could not see for 48 hours  . GERD (gastroesophageal reflux disease)   . Headache   . History of hiatal hernia   . Hypertension   . Pneumonia   . PONV (postoperative nausea and vomiting)   . Prediabetes   . Pulmonary edema    in setting of pregnancy '78    Past Surgical History:  Procedure Laterality Date  . BREAST EXCISIONAL BIOPSY Right   . CARPAL TUNNEL RELEASE  05/2017  . CESAREAN SECTION    . CHOLECYSTECTOMY N/A 11/03/2017   Procedure: LAPAROSCOPIC CHOLECYSTECTOMY WITH ATTEMPTED INTRAOPERATIVE CHOLANGIOGRAM;  Surgeon: Georganna Skeans, MD;  Location: [place];  Service: General;  Laterality: N/A;  . COLONOSCOPY WITH ESOPHAGOGASTRODUODENOSCOPY (EGD)    . FEMUR FRACTURE SURGERY    . UTERINE FIBROID SURGERY       There were no vitals filed for this visit.   Subjective Assessment - 11/28/20 1633    Subjective pt reports she feel a week ago in her back yard when she tripped over a guide wire    Pertinent History arthritis with knee pain right>left, HTN, prediabetic, history of pulmonary edema    Patient Stated Goals Improve walking/ability for activity    Currently in Pain? Yes    Pain Score 5     Pain Location Knee    Pain Orientation Right    Pain Descriptors / Indicators Aching    Pain Type Chronic pain    Pain Onset More than a month ago    Pain Frequency Intermittent    Aggravating Factors  walking, steps    Pain Relieving Factors rest    Effect of Pain on Daily Activities limits walking tolerance    Pain Score 0    Pain Location Knee    Pain Orientation Left    Pain Descriptors / Indicators Aching    Pain Type Chronic pain              [facility] PT Assessment - 11/29/20 0001      Transfers   Five time sit to stand comments  26.6 sec  [facility] Adult PT Treatment/Exercise - 11/29/20 0001      Exercises   Exercises Knee/Hip      Knee/Hip Exercises: Standing   Hip Abduction Right;Left;2 sets;10 reps    Lateral Step Up Right;Left;2 sets;10 reps;Step Height: 2"   airex     Knee/Hip Exercises: Seated   Long Arc [employer] Right;Left;10 reps;2 sets    [employer] Weight 3 lbs.    Ball Squeeze 15x; 5 sec    Marching Right;Left;2 sets;20 reps    Marching Limitations 2 lbs2    Abduction/Adduction  2 sets;15 reps;Both    Abd/Adduction Limitations red Tband    Sit to [employer] 5 reps;2 sets                  PT Education - 11/29/20 0749    Education Details Pt was encourage to complete HEP daily    Person(s) Educated Patient    Methods Explanation    Comprehension Verbalized understanding            PT Short Term Goals - 11/07/20 1525      PT SHORT TERM GOAL #1   Title Independent with initial HEP    Baseline instructed at eval     Time 3    Period Weeks    Status Achieved      PT SHORT TERM GOAL #2   Title Assess Berg to establish LTG for balance/fall risk by visit 3    Time 3    Period Weeks    Status Achieved             PT Long Term Goals - 11/29/20 0751      PT LONG TERM GOAL #1   Title Improve 5 times sit<>stand time to 15 sec or less to improve ability for transfers from low seats    Baseline 20.03 sec    Status On-going    Target Date 01/06/21      PT LONG TERM GOAL #2   Title Improve 2 minute walk test distance to 185 feet or greater to improve ability to tolerance ambulation for activities such as grocery shopping    Baseline 257 feet in 2 min 11-07-20    Time 6    Period Weeks    Status Achieved    Target Date 11/16/20      PT LONG TERM GOAL #3   Title Achieve bilateral knee strength of 5/5 and increase bilat. hip strength grossly 1/2 MMT grade to improve ability to navigate stairs to home entrance and perform transfers from low seats    Status On-going    Target Date 01/06/21      PT LONG TERM GOAL #4   Title Improve FOTO outcome measure score to 38% or less limitation    Baseline 48% limted today 60 % limited  not consistent with progress    Status On-going    Target Date 01/06/21      PT LONG TERM GOAL #5   Title Improve Berg Balance score at least 3-5 points for improved balance to work towards decreased fall risk    Baseline 44/56    Status On-going    Target Date 01/06/21                 Plan - 11/28/20 1654    Clinical Impression Statement PT was completed for LE strengthening to improve pt's functional mobility and safety. Pt did experience 1 fall related to tripping over a guide wire in her  back yard, not seeming related to balance. pt states she was using her cane. Currently with pt's course of PT, she has experienced gains in some functional areas, while deficits reamain in others. Pt will benefit from continued skilled PT to address strength and balance to optimize  her functional mobility and safety.    Personal Factors and Comorbidities Comorbidity 2;Age;Time since onset of injury/illness/exacerbation    Examination-Activity Limitations Transfers;Lift;Squat;Stairs;Locomotion Level;Stand    Examination-Participation Restrictions Meal Prep;Community Activity;Cleaning;Shop    Stability/Clinical Decision Making Evolving/Moderate complexity    Clinical Decision Making Moderate    Rehab Potential Good    PT Frequency 2x / week    PT Duration 4 weeks    PT Treatment/Interventions ADLs/Self Care Home Management;Cryotherapy;Electrical Stimulation;Moist Heat;Gait training;Stair training;Balance training;Functional mobility training;Neuromuscular re-education;Therapeutic activities;Therapeutic exercise;Patient/family education;Manual techniques    PT Next Visit Plan Continue work on general strengthening and conditioning, balance and propriocpetive challenges, pt. safety education, gait training prn lunging and prep for floor to stand, added weights to squats.    PT Home Exercise Plan Access code: [email or web address]    Consulted and Agree with Plan of Care Patient           Patient will benefit from skilled therapeutic intervention in order to improve the following deficits and impairments:  Pain,Decreased balance,Difficulty walking,Decreased endurance,Decreased strength,Decreased activity tolerance,Cardiopulmonary status limiting activity  Visit Diagnosis: Muscle weakness (generalized)  Difficulty in walking, not elsewhere classified  Chronic pain of right knee  Chronic pain of left knee  Other abnormalities of gait and mobility     Problem List Patient Active Problem List   Diagnosis Date Noted  . Lumbosacral radiculopathy 06/21/2016  . 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 Unsteady gait 02/29/2016  . Nausea alone 02/05/2013  . Hypertension 02/03/2013  . Left hand pain 02/03/2013  . Cough 02/03/2013     Gar Ponto MS, PT 11/29/20 8:05 AM  [facility] [address] [place], [place], [address] Phone: [phone]   Fax:  [phone]  Name: Nancy Maddox MRN: [id number] Date of Birth: 1946-07-05   Referring diagnosis? Physical deconditioning Treatment diagnosis? (if different than referring diagnosis) Muscle weakness, difficulty in walking, abnormalities of gait and mobility What was this (referring dx) caused by? []  Surgery [x]  Fall [x]  Ongoing issue [x]  Arthritis []  Other: ____________  Laterality: []  Rt []  Lt [x]  Both  Check all possible CPT codes:      [x]  97110 (Therapeutic Exercise)  []  92507 (SLP Treatment)  []  97112 (Neuro Re-ed)   []  92526 (Swallowing Treatment)   [x]  69629 (Gait Training)   []  [id number] (Cognitive Training, 1st 15 minutes) [x]  97140 (Manual Therapy)   []  97130 (Cognitive Training, each add'l 15 minutes)  [x]  97530 (Therapeutic Activities)  []  Other, List CPT Code ____________    [x]  97535 (Self Care)       []  All codes above (97110 - 97535)  []  97012 (Mechanical Traction)  []  97014 (E-stim Unattended)  []  97032 (E-stim manual)  []  97033 (Ionto)  []  97035 (Ultrasound)  []  97760 (Orthotic Fit) []  97750 (Physical Performance Training) []  [id number] (Aquatic Therapy) []  97034 (Contrast Bath) []  [id number] (Paraffin) []  97597 (Wound Care 1st 20 sq cm) []  97598 (Wound Care each add'l 20 sq cm) []  97016 (Vasopneumatic Device) []  [id number] Comptroller) []  [id number] (Prosthetic Training)

## 2020-12-15 ENCOUNTER — Ambulatory Visit: Payer: Medicare HMO

## 2020-12-19 ENCOUNTER — Ambulatory Visit: Payer: Medicare HMO

## 2020-12-19 ENCOUNTER — Other Ambulatory Visit: Payer: Self-pay

## 2020-12-19 DIAGNOSIS — G8929 Other chronic pain: Secondary | ICD-10-CM

## 2020-12-19 DIAGNOSIS — R2689 Other abnormalities of gait and mobility: Secondary | ICD-10-CM

## 2020-12-19 DIAGNOSIS — M6281 Muscle weakness (generalized): Secondary | ICD-10-CM

## 2020-12-19 DIAGNOSIS — R262 Difficulty in walking, not elsewhere classified: Secondary | ICD-10-CM

## 2020-12-19 NOTE — Therapy (Signed)
[place], [place], [address] Phone: [phone]   Fax:  [phone]  Physical Therapy Treatment  Patient Details  Name: Nancy Maddox MRN: [id number] Date of Birth: [birth year] Referring Provider (PT): Latanya Presser, MD   Encounter Date: 12/19/2020   PT End of Session - 12/19/20 1504    Visit Number 11    Date for PT Re-Evaluation 01/06/21    Authorization Time Period 12/15/20-01/12/21    Authorization - Visit Number 11    Authorization - Number of Visits 18    PT Start Time 1500   pt was 13 min. late   PT Stop Time 1530    PT Time Calculation (min) 30 min    Activity Tolerance Patient limited by fatigue;Patient tolerated treatment well    Behavior During Therapy [facility] for tasks assessed/performed           Past Medical History:  Diagnosis Date  . Anemia   . Arthritis   . Complication of anesthesia    patient states that she was given too much anesthesia, too quickly prior to a C-section and could not see for 48 hours  . GERD (gastroesophageal reflux disease)   . Headache   . History of hiatal hernia   . Hypertension   . Pneumonia   . PONV (postoperative nausea and vomiting)   . Prediabetes   . Pulmonary edema    in setting of pregnancy '78    Past Surgical History:  Procedure Laterality Date  . BREAST EXCISIONAL BIOPSY Right   . CARPAL TUNNEL RELEASE  05/2017  . CESAREAN SECTION    . CHOLECYSTECTOMY N/A 11/03/2017   Procedure: LAPAROSCOPIC CHOLECYSTECTOMY WITH ATTEMPTED INTRAOPERATIVE CHOLANGIOGRAM;  Surgeon: Georganna Skeans, MD;  Location: [place];  Service: General;  Laterality: N/A;  . COLONOSCOPY WITH ESOPHAGOGASTRODUODENOSCOPY (EGD)    . FEMUR FRACTURE SURGERY    . UTERINE FIBROID SURGERY      There were no vitals filed for this visit.   Subjective Assessment - 12/19/20 1511    Subjective pt reports being tried day. Pt denies any new falls.    Patient Stated Goals Improve  walking/ability for activity    Currently in Pain? Yes    Pain Score 5     Pain Location Knee    Pain Orientation Right;Left    Pain Descriptors / Indicators Aching    Pain Type Chronic pain    Pain Onset More than a month ago    Pain Frequency Intermittent    Aggravating Factors  walking, steps    Pain Relieving Factors rest    Effect of Pain on Daily Activities limits walking tolerance                             [facility] Adult PT Treatment/Exercise - 12/19/20 0001      Exercises   Exercises Knee/Hip      Knee/Hip Exercises: Seated   Long Arc Quad Right;Left;10 reps;2 sets    Long Arc Quad Weight 3 lbs.    Long [employer] Limitations with ball squeeze    Ball Squeeze 15x; 5 sec    Marching Right;Left;2 sets;20 reps    Marching Limitations 2 lbs2    Abduction/Adduction  2 sets;15 reps;Both    Abd/Adduction Limitations red Tband      Knee/Hip Exercises: Supine   Straight Leg Raises Right;Left;2 sets;10 reps   towel roll under R knee  PT Short Term Goals - 11/07/20 1525      PT SHORT TERM GOAL #1   Title Independent with initial HEP    Baseline instructed at eval    Time 3    Period Weeks    Status Achieved      PT SHORT TERM GOAL #2   Title Assess Berg to establish LTG for balance/fall risk by visit 3    Time 3    Period Weeks    Status Achieved             PT Long Term Goals - 11/29/20 0751      PT LONG TERM GOAL #1   Title Improve 5 times sit<>stand time to 15 sec or less to improve ability for transfers from low seats    Baseline 20.03 sec    Status On-going    Target Date 01/06/21      PT LONG TERM GOAL #2   Title Improve 2 minute walk test distance to 185 feet or greater to improve ability to tolerance ambulation for activities such as grocery shopping    Baseline 257 feet in 2 min 11-07-20    Time 6    Period Weeks    Status Achieved    Target Date 11/16/20      PT LONG TERM GOAL #3   Title Achieve  bilateral knee strength of 5/5 and increase bilat. hip strength grossly 1/2 MMT grade to improve ability to navigate stairs to home entrance and perform transfers from low seats    Status On-going    Target Date 01/06/21      PT LONG TERM GOAL #4   Title Improve FOTO outcome measure score to 38% or less limitation    Baseline 48% limted today 60 % limited  not consistent with progress    Status On-going    Target Date 01/06/21      PT LONG TERM GOAL #5   Title Improve Berg Balance score at least 3-5 points for improved balance to work towards decreased fall risk    Baseline 44/56    Status On-going    Target Date 01/06/21                 Plan - 12/19/20 1515    Clinical Impression Statement Pt was 13 mins. late for her PT appt. PT was completed for strengthening of her hips and knees. Pt participated with good effort and tolerated the session without adverse effects.. Pt will benefit from PT for LE/trunk strengthening and balance to optimize her function and safety.    Personal Factors and Comorbidities Comorbidity 2;Age;Time since onset of injury/illness/exacerbation    Examination-Activity Limitations Transfers;Lift;Squat;Stairs;Locomotion Level;Stand    Examination-Participation Restrictions Meal Prep;Community Activity;Cleaning;Shop    Stability/Clinical Decision Making Evolving/Moderate complexity    Clinical Decision Making Moderate    Rehab Potential Good    PT Frequency 2x / week    PT Duration 4 weeks    PT Treatment/Interventions ADLs/Self Care Home Management;Cryotherapy;Electrical Stimulation;Moist Heat;Gait training;Stair training;Balance training;Functional mobility training;Neuromuscular re-education;Therapeutic activities;Therapeutic exercise;Patient/family education;Manual techniques    PT Home Exercise Plan Access code: [email or web address]           Patient will benefit from skilled therapeutic intervention in order to improve the following deficits and impairments:   Pain,Decreased balance,Difficulty walking,Decreased endurance,Decreased strength,Decreased activity tolerance,Cardiopulmonary status limiting activity  Visit Diagnosis: Muscle weakness (generalized)  Difficulty in walking, not elsewhere classified  Chronic pain of right knee  Chronic pain of left knee  Other abnormalities of gait and mobility     Problem List Patient Active Problem List   Diagnosis Date Noted  . Lumbosacral radiculopathy 06/21/2016  . 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 Unsteady gait 02/29/2016  . Nausea alone 02/05/2013  . Hypertension 02/03/2013  . Left hand pain 02/03/2013  . Cough 02/03/2013    Gar Ponto MS, PT 12/19/20 4:55 PM  [facility] [address] [place], [place], [address] Phone: [phone]   Fax:  [phone]  Name: Nancy Maddox MRN: [id number] Date of Birth: May 23, 1946

## 2020-12-22 ENCOUNTER — Ambulatory Visit: Payer: Medicare HMO

## 2020-12-22 ENCOUNTER — Other Ambulatory Visit: Payer: Self-pay

## 2020-12-22 DIAGNOSIS — G8929 Other chronic pain: Secondary | ICD-10-CM

## 2020-12-22 DIAGNOSIS — M25562 Pain in left knee: Secondary | ICD-10-CM

## 2020-12-22 DIAGNOSIS — M6281 Muscle weakness (generalized): Secondary | ICD-10-CM | POA: Diagnosis not present

## 2020-12-22 DIAGNOSIS — R262 Difficulty in walking, not elsewhere classified: Secondary | ICD-10-CM

## 2020-12-22 DIAGNOSIS — R2689 Other abnormalities of gait and mobility: Secondary | ICD-10-CM

## 2020-12-22 NOTE — Therapy (Signed)
[place], [place], [address] Phone: [phone]   Fax:  [phone]  Physical Therapy Treatment  Patient Details  Name: Nancy Maddox MRN: [id number] Date of Birth: 1946-03-26 Referring Provider (PT): Latanya Presser, MD   Encounter Date: 12/22/2020   PT End of Session - 12/22/20 1403    Visit Number 12    Number of Visits 18    Date for PT Re-Evaluation 01/06/21    Authorization Type [employer] MCR    Authorization Time Period 12/15/20-01/12/21    Authorization - Visit Number 12    Authorization - Number of Visits 18    PT Start Time 1351    PT Stop Time 1435    PT Time Calculation (min) 44 min    Activity Tolerance Patient limited by fatigue;Patient tolerated treatment well    Behavior During Therapy [facility] for tasks assessed/performed           Past Medical History:  Diagnosis Date  . Anemia   . Arthritis   . Complication of anesthesia    patient states that she was given too much anesthesia, too quickly prior to a C-section and could not see for 48 hours  . GERD (gastroesophageal reflux disease)   . Headache   . History of hiatal hernia   . Hypertension   . Pneumonia   . PONV (postoperative nausea and vomiting)   . Prediabetes   . Pulmonary edema    in setting of pregnancy '78    Past Surgical History:  Procedure Laterality Date  . BREAST EXCISIONAL BIOPSY Right   . CARPAL TUNNEL RELEASE  05/2017  . CESAREAN SECTION    . CHOLECYSTECTOMY N/A 11/03/2017   Procedure: LAPAROSCOPIC CHOLECYSTECTOMY WITH ATTEMPTED INTRAOPERATIVE CHOLANGIOGRAM;  Surgeon: Georganna Skeans, MD;  Location: [place];  Service: General;  Laterality: N/A;  . COLONOSCOPY WITH ESOPHAGOGASTRODUODENOSCOPY (EGD)    . FEMUR FRACTURE SURGERY    . UTERINE FIBROID SURGERY      There were no vitals filed for this visit.   Subjective Assessment - 12/22/20 1357    Subjective Pt reports she is feeling better today.    Pertinent  History arthritis with knee pain right>left, HTN, prediabetic, history of pulmonary edema    Patient Stated Goals Improve walking/ability for activity    Currently in Pain? Yes    Pain Score 4     Pain Location Knee    Pain Orientation Right;Left    Pain Descriptors / Indicators Aching    Pain Type Chronic pain    Pain Onset More than a month ago    Pain Frequency Intermittent                             [facility] Adult PT Treatment/Exercise - 12/22/20 0001      Exercises   Exercises Knee/Hip      Knee/Hip Exercises: Aerobic   Nustep 6 mins; L3; UEs and LEs      Knee/Hip Exercises: Seated   Long Arc Quad Right;Left;10 reps;2 sets    [employer] Weight 2 lbs.    Long [employer] Limitations with ball squeeze    Sit to [employer] 5 reps;2 sets;with UE support      Knee/Hip Exercises: Supine   Hip Adduction Isometric 15 reps    Hip Adduction Isometric Limitations ball sqeeze    Bridges 15 reps    Straight Leg Raises Right;Left;2 sets;10 reps  towel roll under R knee   Other Supine Knee/Hip Exercises Hip abd c Red Tband 15x      Knee/Hip Exercises: Sidelying   Hip ABduction Right;Left;2 sets;10 reps               Balance Exercises - 12/22/20 0001      Balance Exercises: Standing   Standing Eyes Opened Narrow base of support (BOS);1 rep;20 secs    Tandem Stance Eyes open;2 reps;20 secs   each foot forward              PT Short Term Goals - 11/07/20 1525      PT SHORT TERM GOAL #1   Title Independent with initial HEP    Baseline instructed at eval    Time 3    Period Weeks    Status Achieved      PT SHORT TERM GOAL #2   Title Assess Berg to establish LTG for balance/fall risk by visit 3    Time 3    Period Weeks    Status Achieved             PT Long Term Goals - 11/29/20 0751      PT LONG TERM GOAL #1   Title Improve 5 times sit<>stand time to 15 sec or less to improve ability for transfers from low seats    Baseline 20.03 sec     Status On-going    Target Date 01/06/21      PT LONG TERM GOAL #2   Title Improve 2 minute walk test distance to 185 feet or greater to improve ability to tolerance ambulation for activities such as grocery shopping    Baseline 257 feet in 2 min 11-07-20    Time 6    Period Weeks    Status Achieved    Target Date 11/16/20      PT LONG TERM GOAL #3   Title Achieve bilateral knee strength of 5/5 and increase bilat. hip strength grossly 1/2 MMT grade to improve ability to navigate stairs to home entrance and perform transfers from low seats    Status On-going    Target Date 01/06/21      PT LONG TERM GOAL #4   Title Improve FOTO outcome measure score to 38% or less limitation    Baseline 48% limted today 60 % limited  not consistent with progress    Status On-going    Target Date 01/06/21      PT LONG TERM GOAL #5   Title Improve Berg Balance score at least 3-5 points for improved balance to work towards decreased fall risk    Baseline 44/56    Status On-going    Target Date 01/06/21                 Plan - 12/22/20 1404    Clinical Impression Statement PT continues to be completed for LE and lumbosacral strengthening and balance. Ther ex was progressed per resistannce level and number of repetitions. Pt participated with good effort and tolerated the session without adverse effects.    Personal Factors and Comorbidities Comorbidity 2;Age;Time since onset of injury/illness/exacerbation    Examination-Activity Limitations Transfers;Lift;Squat;Stairs;Locomotion Level;Stand    Examination-Participation Restrictions Meal Prep;Community Activity;Cleaning;Shop    Stability/Clinical Decision Making Evolving/Moderate complexity    Clinical Decision Making Moderate    Rehab Potential Good    PT Frequency 2x / week    PT Duration 4 weeks    PT Treatment/Interventions ADLs/Self Care Home  Management;Cryotherapy;Electrical Stimulation;Moist Heat;Gait training;Stair training;Balance  training;Functional mobility training;Neuromuscular re-education;Therapeutic activities;Therapeutic exercise;Patient/family education;Manual techniques    PT Next Visit Plan Re-assess PRN. work on general strengthening and conditioning, balance and propriocpetive challenges, pt. safety education, gait training prn.    PT Home Exercise Plan Access code: [email or web address]    Consulted and Agree with Plan of Care Patient           Patient will benefit from skilled therapeutic intervention in order to improve the following deficits and impairments:  Pain,Decreased balance,Difficulty walking,Decreased endurance,Decreased strength,Decreased activity tolerance,Cardiopulmonary status limiting activity  Visit Diagnosis: Muscle weakness (generalized)  Difficulty in walking, not elsewhere classified  Chronic pain of right knee  Chronic pain of left knee  Other abnormalities of gait and mobility     Problem List Patient Active Problem List   Diagnosis Date Noted  . Lumbosacral radiculopathy 06/21/2016  . 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 Unsteady gait 02/29/2016  . Nausea alone 02/05/2013  . Hypertension 02/03/2013  . Left hand pain 02/03/2013  . Cough 02/03/2013    Gar Ponto 12/22/2020, 2:44 PM  [facility] [address] [place], [place], [address] Phone: [phone]   Fax:  [phone]  Name: Nancy Maddox MRN: [id number] Date of Birth: 08-19-1946

## 2020-12-26 ENCOUNTER — Ambulatory Visit: Payer: Medicare HMO

## 2020-12-26 ENCOUNTER — Other Ambulatory Visit: Payer: Self-pay

## 2020-12-26 DIAGNOSIS — R262 Difficulty in walking, not elsewhere classified: Secondary | ICD-10-CM

## 2020-12-26 DIAGNOSIS — M6281 Muscle weakness (generalized): Secondary | ICD-10-CM

## 2020-12-26 DIAGNOSIS — G8929 Other chronic pain: Secondary | ICD-10-CM

## 2020-12-26 DIAGNOSIS — M25562 Pain in left knee: Secondary | ICD-10-CM

## 2020-12-26 DIAGNOSIS — R2689 Other abnormalities of gait and mobility: Secondary | ICD-10-CM

## 2020-12-26 NOTE — Therapy (Signed)
[place], [place], [address] Phone: [phone]   Fax:  [phone]  Physical Therapy Treatment  Patient Details  Name: Nancy Maddox MRN: [id number] Date of Birth: 10/13/1945 Referring Provider (PT): Latanya Presser, MD   Encounter Date: 12/26/2020   PT End of Session - 12/26/20 1503    Visit Number 13    Number of Visits 18    Date for PT Re-Evaluation 01/06/21    Authorization Type [employer] MCR    Authorization Time Period 12/15/20-01/12/21    Authorization - Visit Number 13    Authorization - Number of Visits 18    PT Start Time 4196    PT Stop Time 1534    PT Time Calculation (min) 41 min    Activity Tolerance Patient limited by fatigue;Patient tolerated treatment well    Behavior During Therapy [facility] for tasks assessed/performed           Past Medical History:  Diagnosis Date  . Anemia   . Arthritis   . Complication of anesthesia    patient states that she was given too much anesthesia, too quickly prior to a C-section and could not see for 48 hours  . GERD (gastroesophageal reflux disease)   . Headache   . History of hiatal hernia   . Hypertension   . Pneumonia   . PONV (postoperative nausea and vomiting)   . Prediabetes   . Pulmonary edema    in setting of pregnancy '78    Past Surgical History:  Procedure Laterality Date  . BREAST EXCISIONAL BIOPSY Right   . CARPAL TUNNEL RELEASE  05/2017  . CESAREAN SECTION    . CHOLECYSTECTOMY N/A 11/03/2017   Procedure: LAPAROSCOPIC CHOLECYSTECTOMY WITH ATTEMPTED INTRAOPERATIVE CHOLANGIOGRAM;  Surgeon: Georganna Skeans, MD;  Location: [place];  Service: General;  Laterality: N/A;  . COLONOSCOPY WITH ESOPHAGOGASTRODUODENOSCOPY (EGD)    . FEMUR FRACTURE SURGERY    . UTERINE FIBROID SURGERY      There were no vitals filed for this visit.   Subjective Assessment - 12/26/20 1501    Subjective Pt offered no new concerns.    Pertinent History  arthritis with knee pain right>left, HTN, prediabetic, history of pulmonary edema    Patient Stated Goals Improve walking/ability for activity    Currently in Pain? Yes    Pain Score 5     Pain Location Knee    Pain Orientation Right    Pain Descriptors / Indicators Aching    Pain Type Chronic pain    Pain Onset More than a month ago    Pain Frequency Intermittent    Pain Score 0    Pain Location Knee    Pain Orientation Left    Pain Descriptors / Indicators Aching    Pain Type Chronic pain    Pain Onset More than a month ago    Pain Frequency Intermittent                             [facility] Adult PT Treatment/Exercise - 12/26/20 0001      Exercises   Exercises Knee/Hip      Knee/Hip Exercises: Aerobic   Nustep 5 mins; L3; UEs and LEs      Knee/Hip Exercises: Standing   Heel Raises Both;15 reps    Heel Raises Limitations toe lifts    Knee Flexion Right;Left;10 reps    Knee Flexion Limitations 2 lbs  Hip Flexion Right;Left;10 reps;Knee straight    Hip Flexion Limitations 2 lbs    Hip Abduction Right;Left;2 sets;10 reps    Abduction Limitations 2 lbs      Knee/Hip Exercises: Seated   Long Arc Quad Right;Left;10 reps;2 sets    Long Arc Quad Weight 2 lbs.    Long [employer] Limitations with ball squeeze    Sit to [employer] 10 reps;with UE support      Knee/Hip Exercises: Supine   Straight Leg Raises Right;Left;10 reps   towel roll under R knee   Other Supine Knee/Hip Exercises Hip abd c Red Tband 15x               Balance Exercises - 12/26/20 0001      Balance Exercises: Standing   Standing Eyes Opened Narrow base of support (BOS);1 rep;20 secs    Tandem Stance Eyes open;2 reps;20 secs   each foot forward              PT Short Term Goals - 11/07/20 1525      PT SHORT TERM GOAL #1   Title Independent with initial HEP    Baseline instructed at eval    Time 3    Period Weeks    Status Achieved      PT SHORT TERM GOAL #2   Title Assess  Berg to establish LTG for balance/fall risk by visit 3    Time 3    Period Weeks    Status Achieved             PT Long Term Goals - 11/29/20 0751      PT LONG TERM GOAL #1   Title Improve 5 times sit<>stand time to 15 sec or less to improve ability for transfers from low seats    Baseline 20.03 sec    Status On-going    Target Date 01/06/21      PT LONG TERM GOAL #2   Title Improve 2 minute walk test distance to 185 feet or greater to improve ability to tolerance ambulation for activities such as grocery shopping    Baseline 257 feet in 2 min 11-07-20    Time 6    Period Weeks    Status Achieved    Target Date 11/16/20      PT LONG TERM GOAL #3   Title Achieve bilateral knee strength of 5/5 and increase bilat. hip strength grossly 1/2 MMT grade to improve ability to navigate stairs to home entrance and perform transfers from low seats    Status On-going    Target Date 01/06/21      PT LONG TERM GOAL #4   Title Improve FOTO outcome measure score to 38% or less limitation    Baseline 48% limted today 60 % limited  not consistent with progress    Status On-going    Target Date 01/06/21      PT LONG TERM GOAL #5   Title Improve Berg Balance score at least 3-5 points for improved balance to work towards decreased fall risk    Baseline 44/56    Status On-going    Target Date 01/06/21                 Plan - 12/26/20 1505    Clinical Impression Statement PT was completed for LE strengthening and balance. Pt reports consistent completion of HEP. With balance exs, pt was bale to complete narrow BOS and partial tandem standing without UE assist. Pt  tolerated today's PT session s adverse effects. pt will continue to benefit from PT to address strength and balance to optimize functional mobility.    Personal Factors and Comorbidities Comorbidity 2;Age;Time since onset of injury/illness/exacerbation    Examination-Activity Limitations Transfers;Lift;Squat;Stairs;Locomotion  Level;Stand    Examination-Participation Restrictions Meal Prep;Community Activity;Cleaning;Shop    Stability/Clinical Decision Making Evolving/Moderate complexity    Clinical Decision Making Moderate    Rehab Potential Good    PT Frequency 2x / week    PT Duration 4 weeks    PT Treatment/Interventions ADLs/Self Care Home Management;Cryotherapy;Electrical Stimulation;Moist Heat;Gait training;Stair training;Balance training;Functional mobility training;Neuromuscular re-education;Therapeutic activities;Therapeutic exercise;Patient/family education;Manual techniques    PT Next Visit Plan Re-assess some functional measures. work on general strengthening and conditioning, balance and propriocpetive challenges, pt. safety education, gait training prn.    PT Home Exercise Plan Access code: [email or web address]    Consulted and Agree with Plan of Care Patient           Patient will benefit from skilled therapeutic intervention in order to improve the following deficits and impairments:  Pain,Decreased balance,Difficulty walking,Decreased endurance,Decreased strength,Decreased activity tolerance,Cardiopulmonary status limiting activity  Visit Diagnosis: Muscle weakness (generalized)  Difficulty in walking, not elsewhere classified  Chronic pain of right knee  Chronic pain of left knee  Other abnormalities of gait and mobility     Problem List Patient Active Problem List   Diagnosis Date Noted  . Lumbosacral radiculopathy 06/21/2016  . 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 Unsteady gait 02/29/2016  . Nausea alone 02/05/2013  . Hypertension 02/03/2013  . Left hand pain 02/03/2013  . Cough 02/03/2013    Gar Ponto MS, PT 12/26/20 6:24 PM  [facility] [address] [place], [place], [address] Phone: [phone]   Fax:  [phone]  Name: Nancy Maddox MRN: [id number] Date of Birth:  1946-01-31

## 2020-12-29 ENCOUNTER — Ambulatory Visit: Payer: Medicare HMO | Attending: Internal Medicine

## 2020-12-29 DIAGNOSIS — R2689 Other abnormalities of gait and mobility: Secondary | ICD-10-CM

## 2020-12-29 DIAGNOSIS — M25562 Pain in left knee: Secondary | ICD-10-CM | POA: Insufficient documentation

## 2020-12-29 DIAGNOSIS — R262 Difficulty in walking, not elsewhere classified: Secondary | ICD-10-CM | POA: Diagnosis present

## 2020-12-29 DIAGNOSIS — G8929 Other chronic pain: Secondary | ICD-10-CM

## 2020-12-29 DIAGNOSIS — M6281 Muscle weakness (generalized): Secondary | ICD-10-CM

## 2020-12-29 DIAGNOSIS — M25561 Pain in right knee: Secondary | ICD-10-CM | POA: Diagnosis present

## 2020-12-29 NOTE — Therapy (Signed)
[place], [place], [address] Phone: [phone]   Fax:  [phone]  Physical Therapy Treatment  Patient Details  Name: Nancy Maddox MRN: [id number] Date of Birth: June 18, 1946 Referring Provider (PT): Latanya Presser, MD   Encounter Date: 12/29/2020   PT End of Session - 12/29/20 1355    Visit Number 14    Number of Visits 18    Date for PT Re-Evaluation 01/06/21    Authorization Type [employer] M[employer]    Authorization Time Period 12/15/20-01/12/21    Authorization - Visit Number 14    Authorization - Number of Visits 18    PT Start Time 1027    PT Stop Time 1436    PT Time Calculation (min) 44 min    Activity Tolerance Patient limited by fatigue;Patient tolerated treatment well    Behavior During Therapy [facility] for tasks assessed/performed           Past Medical History:  Diagnosis Date  . Anemia   . Arthritis   . Complication of anesthesia    patient states that she was given too much anesthesia, too quickly prior to a C-section and could not see for 48 hours  . GERD (gastroesophageal reflux disease)   . Headache   . History of hiatal hernia   . Hypertension   . Pneumonia   . PONV (postoperative nausea and vomiting)   . Prediabetes   . Pulmonary edema    in setting of pregnancy '78    Past Surgical History:  Procedure Laterality Date  . BREAST EXCISIONAL BIOPSY Right   . CARPAL TUNNEL RELEASE  05/2017  . CESAREAN SECTION    . CHOLECYSTECTOMY N/A 11/03/2017   Procedure: LAPAROSCOPIC CHOLECYSTECTOMY WITH ATTEMPTED INTRAOPERATIVE CHOLANGIOGRAM;  Surgeon: Georganna Skeans, MD;  Location: [facility];  Service: General;  Laterality: N/A;  . COLONOSCOPY WITH ESOPHAGOGASTRODUODENOSCOPY (EGD)    . FEMUR FRACTURE SURGERY    . UTERINE FIBROID SURGERY      There were no vitals filed for this visit.   Subjective Assessment - 12/29/20 1357    Subjective Pt reports she had blood drawn this AM, and she is feeling  a little weak.    Pertinent History arthritis with knee pain right>left, HTN, prediabetic, history of pulmonary edema    Limitations Walking;Standing;House hold activities    Patient Stated Goals Improve walking/ability for activity    Currently in Pain? Yes    Pain Score 5     Pain Location Knee    Pain Orientation Right    Pain Descriptors / Indicators Aching    Pain Type Chronic pain    Pain Onset More than a month ago    Pain Frequency Intermittent    Pain Score 0    Pain Location Knee    Pain Orientation Left              [facility] PT Assessment - 12/29/20 0001      Transfers   Five time sit to stand comments  17.40      Balance   Balance Assessed Yes      Standardized Balance Assessment   Standardized Balance Assessment Berg Balance Test      Berg Balance Test   Sit to Stand Able to stand  independently using hands    Standing Unsupported Able to stand safely 2 minutes    Sitting with Back Unsupported but Feet Supported on Floor or Stool Able to sit safely and securely 2  minutes    Stand to Sit Sits safely with minimal use of hands    Transfers Able to transfer safely, minor use of hands    Standing Unsupported with Eyes Closed Able to stand 10 seconds safely    Standing Unsupported with Feet Together Able to place feet together independently and stand 1 minute safely    From Standing, Reach Forward with Outstretched Arm Can reach confidently >25 cm (10")    From Standing Position, Pick up Object from Floor Able to pick up shoe safely and easily    From Standing Position, Turn to Look Behind Over each Shoulder Looks behind one side only/other side shows less weight shift    Turn 360 Degrees Able to turn 360 degrees safely but slowly    Standing Unsupported, Alternately Place Feet on Step/Stool Able to stand independently and complete 8 steps >20 seconds    Standing Unsupported, One Foot in Front Able to plae foot ahead of the other independently and hold 30 seconds     Standing on One Leg Able to lift leg independently and hold equal to or more than 3 seconds    Total Score 48                         [facility] Adult PT Treatment/Exercise - 12/29/20 0001      Exercises   Exercises Knee/Hip      Knee/Hip Exercises: Aerobic   Nustep 5 mins; L1; LEs only      Knee/Hip Exercises: Seated   Long Arc Quad Right;Left;10 reps;2 sets    [employer] Weight 2 lbs.    Long [employer] Limitations with ball squeeze    Ball Squeeze 15x; 5 sec    Marching Right;Left;2 sets;20 reps    Marching Limitations red Tband    Abduction/Adduction  2 sets;15 reps;Both    Abd/Adduction Limitations red Tband    Sit to Sand 10 reps;with UE support                    PT Short Term Goals - 11/07/20 1525      PT SHORT TERM GOAL #1   Title Independent with initial HEP    Baseline instructed at eval    Time 3    Period Weeks    Status Achieved      PT SHORT TERM GOAL #2   Title Assess Berg to establish LTG for balance/fall risk by visit 3    Time 3    Period Weeks    Status Achieved             PT Long Term Goals - 12/29/20 1410      PT LONG TERM GOAL #1   Title Improve 5 times sit<>stand time to 15 sec or less to improve ability for transfers from low seats. 12/29/20    Baseline 20.03 sec    Status Achieved    Target Date 12/29/20      PT LONG TERM GOAL #5   Title Improve Berg Balance score at least 3-5 points for improved balance to work towards decreased fall risk. 12/29/20- 48/56    Baseline 44/56    Status Achieved    Target Date 12/29/20                 Plan - 12/29/20 1402    Clinical Impression Statement Continued PT for LE strengthening and balance. 2 LTGs were re-assessed, 5xSTS, and Berg,  and with both functional measures they were found to be improved, meeting the set goals indicating improved strength and balance for optimal function.    Personal Factors and Comorbidities Comorbidity 2;Age;Time since onset of  injury/illness/exacerbation    Examination-Activity Limitations Transfers;Lift;Squat;Stairs;Locomotion Level;Stand    Examination-Participation Restrictions Meal Prep;Community Activity;Cleaning;Shop    Stability/Clinical Decision Making Evolving/Moderate complexity    Clinical Decision Making Moderate    Rehab Potential Good    PT Frequency 2x / week    PT Duration 4 weeks    PT Treatment/Interventions ADLs/Self Care Home Management;Cryotherapy;Electrical Stimulation;Moist Heat;Gait training;Stair training;Balance training;Functional mobility training;Neuromuscular re-education;Therapeutic activities;Therapeutic exercise;Patient/family education;Manual techniques    PT Next Visit Plan Complete re-assessment of functional measures develop final HEP.    PT Home Exercise Plan Access code: [email or web address]    Consulted and Agree with Plan of Care Patient           Patient will benefit from skilled therapeutic intervention in order to improve the following deficits and impairments:  Pain,Decreased balance,Difficulty walking,Decreased endurance,Decreased strength,Decreased activity tolerance,Cardiopulmonary status limiting activity  Visit Diagnosis: Muscle weakness (generalized)  Difficulty in walking, not elsewhere classified  Chronic pain of right knee  Chronic pain of left knee  Other abnormalities of gait and mobility     Problem List Patient Active Problem List   Diagnosis Date Noted  . Lumbosacral radiculopathy 06/21/2016  . 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 Unsteady gait 02/29/2016  . Nausea alone 02/05/2013  . Hypertension 02/03/2013  . Left hand pain 02/03/2013  . Cough 02/03/2013   Gar Ponto MS, PT 12/29/20 2:56 PM  [facility] [address] [place], [place], [address] Phone: [phone]   Fax:  [phone]  Name: PHILICIA HEYNE MRN: [id number] Date of Birth:  [birth year]

## 2021-01-05 ENCOUNTER — Other Ambulatory Visit: Payer: Self-pay

## 2021-01-05 ENCOUNTER — Ambulatory Visit: Payer: Medicare HMO

## 2021-01-05 DIAGNOSIS — M25562 Pain in left knee: Secondary | ICD-10-CM

## 2021-01-05 DIAGNOSIS — M6281 Muscle weakness (generalized): Secondary | ICD-10-CM | POA: Diagnosis not present

## 2021-01-05 DIAGNOSIS — G8929 Other chronic pain: Secondary | ICD-10-CM

## 2021-01-05 DIAGNOSIS — R262 Difficulty in walking, not elsewhere classified: Secondary | ICD-10-CM

## 2021-01-05 DIAGNOSIS — R2689 Other abnormalities of gait and mobility: Secondary | ICD-10-CM

## 2021-01-05 NOTE — Therapy (Signed)
[facility] [address] [place], [place], [address] Phone: [phone]   Fax:  [phone]  Physical Therapy Treatment  Patient Details  Name: Nancy Maddox MRN: [id number] Date of Birth: 04/30/1946 Referring Provider (PT): Mobolaji Bakare, MD   Encounter Date: 01/05/2021   PT End of Session - 01/05/21 1359    Visit Number 15    Number of Visits 15    Date for PT Re-Evaluation 01/06/21    Authorization Type [employer] M[employer]    Authorization Time Period 12/15/20-01/12/21    Authorization - Visit Number 15    Authorization - Number of Visits 18    PT Start Time 1355    PT Stop Time 1435    PT Time Calculation (min) 40 min    Activity Tolerance Patient limited by fatigue;Patient tolerated treatment well    Behavior During Therapy WFL for tasks assessed/performed           Past Medical History:  Diagnosis Date  . Anemia   . Arthritis   . Complication of anesthesia    patient states that she was given too much anesthesia, too quickly prior to a C-section and could not see for 48 hours  . GERD (gastroesophageal reflux disease)   . Headache   . History of hiatal hernia   . Hypertension   . Pneumonia   . PONV (postoperative nausea and vomiting)   . Prediabetes   . Pulmonary edema    in setting of pregnancy '78    Past Surgical History:  Procedure Laterality Date  . BREAST EXCISIONAL BIOPSY Right   . CARPAL TUNNEL RELEASE  05/2017  . CESAREAN SECTION    . CHOLECYSTECTOMY N/A 11/03/2017   Procedure: LAPAROSCOPIC CHOLECYSTECTOMY WITH ATTEMPTED INTRAOPERATIVE CHOLANGIOGRAM;  Surgeon: Thompson, Burke, MD;  Location: [facility];  Service: General;  Laterality: N/A;  . COLONOSCOPY WITH ESOPHAGOGASTRODUODENOSCOPY (EGD)    . FEMUR FRACTURE SURGERY    . UTERINE FIBROID SURGERY      There were no vitals filed for this visit.   Subjective Assessment - 01/05/21 1536    Subjective Pt reports she is stronger, but her R knee continues to  bother her.    Patient Stated Goals Improve walking/ability for activity    Currently in Pain? Yes    Pain Score 5     Pain Location Knee    Pain Orientation Right    Pain Descriptors / Indicators Aching    Pain Type Chronic pain    Pain Onset More than a month ago    Pain Frequency Intermittent    Aggravating Factors  walking, steps    Pain Relieving Factors rest    Pain Score 0    Pain Location Knee    Pain Orientation Left              [facility] PT Assessment - 01/05/21 0001      Observation/Other Assessments   Focus on Therapeutic Outcomes (FOTO)  47% limited      Strength   Right Hip Flexion 4+/5    Right Hip Extension 4+/5    Right Hip External Rotation  4+/5    Right Hip Internal Rotation 4+/5    Right Hip ABduction 4+/5    Right Hip ADduction 5/5    Left Hip Flexion 4+/5    Left Hip Extension 4+/5    Left Hip External Rotation 4+/5    Left Hip Internal Rotation 4+/5    Left Hip ABduction 4+/5      Left Hip ADduction 5/5    Right Knee Flexion 5/5    Right Knee Extension 5/5    Left Knee Flexion 5/5    Left Knee Extension 5/5      Ambulation/Gait   Gait Comments 2 min walk test-275 feet with wide-based quad cane, pt. ambulates mod I with AD with decreased right steplength at decreased cadence, antalgic gait                         [facility] Adult PT Treatment/Exercise - 01/05/21 0001      Exercises   Exercises Knee/Hip      Knee/Hip Exercises: Aerobic   Nustep 5 mins; L3; LEs only      Knee/Hip Exercises: Standing   Heel Raises Both;15 reps    Heel Raises Limitations toe lifts    Knee Flexion Right;Left;10 reps    Knee Flexion Limitations 2 lbs    Hip Flexion Right;Left;10 reps;Knee straight    Hip Flexion Limitations green Tband    Hip Abduction Right;Left;2 sets;10 reps    Abduction Limitations green Tband    Hip Extension Right;Left;10 reps    Extension Limitations green Tband      Knee/Hip Exercises: Seated   Sit to Sand 10 reps;with  UE support                  PT Education - 01/05/21 1539    Education Details Final HEP    Person(s) Educated Patient    Methods Explanation;Handout    Comprehension Verbalized understanding;Returned demonstration            PT Short Term Goals - 11/07/20 1525      PT SHORT TERM GOAL #1   Title Independent with initial HEP    Baseline instructed at eval    Time 3    Period Weeks    Status Achieved      PT SHORT TERM GOAL #2   Title Assess Berg to establish LTG for balance/fall risk by visit 3    Time 3    Period Weeks    Status Achieved             PT Long Term Goals - 01/05/21 1432      PT LONG TERM GOAL #2   Title Improve 2 minute walk test distance to 185 feet or greater to improve ability to tolerance ambulation for activities such as grocery shopping    Baseline 257 feet in 2 min 11-07-20    Status Achieved    Target Date 01/05/21      PT LONG TERM GOAL #3   Title Achieve bilateral knee strength of 5/5 and increase bilat. hip strength grossly 1/2 MMT grade to improve ability to navigate stairs to home entrance and perform transfers from low seats    Status Achieved      PT LONG TERM GOAL #4   Title Improve FOTO outcome measure score to 38% or less limitation. 47% limited, pt has improved with all strength and functional measures, but R knee pain continue to be an issue.    Baseline 48%. Limted 60 % limited  not consistent with progress    Status Not Met    Target Date 01/05/21                 Plan - 01/05/21 1357    Clinical Impression Statement Pt has made good gains in PT for LE strength, balance, and functional mobility meeting   all goals for these areas. Pt continues to experience pain with her R arthirtic knee which impacted FOTO score progress. Pt is Ind in a home HEP to mainatain her achived LOF. Pt is in agreement with DC.    Personal Factors and Comorbidities Comorbidity 2;Age;Time since onset of injury/illness/exacerbation     Examination-Activity Limitations Transfers;Lift;Squat;Stairs;Locomotion Level;Stand    Examination-Participation Restrictions Meal Prep;Community Activity;Cleaning;Shop    Stability/Clinical Decision Making Evolving/Moderate complexity    Clinical Decision Making Moderate    Rehab Potential Good    PT Frequency 2x / week    PT Duration 4 weeks    PT Treatment/Interventions ADLs/Self Care Home Management;Cryotherapy;Electrical Stimulation;Moist Heat;Gait training;Stair training;Balance training;Functional mobility training;Neuromuscular re-education;Therapeutic activities;Therapeutic exercise;Patient/family education;Manual techniques    PT Next Visit Plan Complete re-assessment of functional measures develop final HEP.    PT Home Exercise Plan Access code: [email or web address]    Consulted and Agree with Plan of Care Patient           Patient will benefit from skilled therapeutic intervention in order to improve the following deficits and impairments:  Pain,Decreased balance,Difficulty walking,Decreased endurance,Decreased strength,Decreased activity tolerance,Cardiopulmonary status limiting activity  Visit Diagnosis: Muscle weakness (generalized)  Difficulty in walking, not elsewhere classified  Chronic pain of right knee  Chronic pain of left knee  Other abnormalities of gait and mobility     Problem List Patient Active Problem List   Diagnosis Date Noted  . Lumbosacral radiculopathy 06/21/2016  . 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 Unsteady gait 02/29/2016  . Nausea alone 02/05/2013  . Hypertension 02/03/2013  . Left hand pain 02/03/2013  . Cough 02/03/2013      MS, PT 01/05/21 3:48 PM  [facility] [address] [place], [place], [address] Phone: [phone]   Fax:  [phone]  Name: Nancy Maddox MRN: [id number] Date of Birth: 07/22/1946   

## 2021-03-26 ENCOUNTER — Other Ambulatory Visit: Payer: Self-pay | Admitting: Cardiology

## 2021-03-26 DIAGNOSIS — R072 Precordial pain: Secondary | ICD-10-CM

## 2021-03-27 ENCOUNTER — Other Ambulatory Visit: Payer: Self-pay

## 2021-03-27 DIAGNOSIS — R072 Precordial pain: Secondary | ICD-10-CM

## 2021-03-27 MED ORDER — METOPROLOL SUCCINATE ER 25 MG PO TB24: 25.0000 mg | ORAL_TABLET | Freq: Every day | ORAL | 1 refills | Status: AC

## 2021-04-17 ENCOUNTER — Other Ambulatory Visit: Payer: Self-pay | Admitting: Internal Medicine

## 2021-04-17 DIAGNOSIS — Z1231 Encounter for screening mammogram for malignant neoplasm of breast: Secondary | ICD-10-CM

## 2021-04-17 DIAGNOSIS — Z13820 Encounter for screening for osteoporosis: Secondary | ICD-10-CM

## 2021-07-02 ENCOUNTER — Emergency Department (HOSPITAL_COMMUNITY)
Admission: EM | Admit: 2021-07-02 | Discharge: 2021-07-02 | Disposition: A | Discharge status: Home / Self Care | Payer: Medicare HMO | Attending: Emergency Medicine | Admitting: Emergency Medicine

## 2021-07-02 DIAGNOSIS — Z79899 Other long term (current) drug therapy: Secondary | ICD-10-CM | POA: Insufficient documentation

## 2021-07-02 DIAGNOSIS — Z7722 Contact with and (suspected) exposure to environmental tobacco smoke (acute) (chronic): Secondary | ICD-10-CM | POA: Diagnosis not present

## 2021-07-02 DIAGNOSIS — Z7982 Long term (current) use of aspirin: Secondary | ICD-10-CM | POA: Insufficient documentation

## 2021-07-02 DIAGNOSIS — T783XXA Angioneurotic edema, initial encounter: Secondary | ICD-10-CM

## 2021-07-02 DIAGNOSIS — I10 Essential (primary) hypertension: Secondary | ICD-10-CM | POA: Insufficient documentation

## 2021-07-02 DIAGNOSIS — R220 Localized swelling, mass and lump, head: Secondary | ICD-10-CM | POA: Diagnosis present

## 2021-07-02 MED ORDER — EPINEPHRINE 0.3 MG/0.3ML IJ SOAJ: 0.3000 mg | INTRAMUSCULAR | 0 refills | Status: AC | PRN

## 2021-07-02 MED ORDER — EPINEPHRINE 0.3 MG/0.3ML IJ SOAJ
0.3000 mg | Freq: Once | INTRAMUSCULAR | Status: AC
  Administered 2021-07-02: 0.3 mg via INTRAMUSCULAR

## 2021-07-02 MED ORDER — PREDNISONE 10 MG PO TABS
40.0000 mg | ORAL_TABLET | Freq: Every day | ORAL | 0 refills | Status: DC
Start: 2021-07-02 — End: 2021-08-17

## 2021-07-02 MED ORDER — METHYLPREDNISOLONE SODIUM SUCC 125 MG IJ SOLR
125.0000 mg | Freq: Once | INTRAMUSCULAR | Status: AC
  Administered 2021-07-02: 125 mg via INTRAVENOUS
  Filled 2021-07-02: qty 2

## 2021-07-02 MED ORDER — FAMOTIDINE IN NACL 20-0.9 MG/50ML-% IV SOLN
20.0000 mg | Freq: Once | INTRAVENOUS | Status: AC
  Administered 2021-07-02: 20 mg via INTRAVENOUS
  Filled 2021-07-02: qty 50

## 2021-07-02 MED ORDER — EPINEPHRINE 0.3 MG/0.3ML IJ SOAJ: 0.3000 mg | Freq: Once | INTRAMUSCULAR | Status: DC

## 2021-07-02 NOTE — Discharge Instructions (Addendum)
Return for any problem.  Stop taking Losartan (Cozaar).   Your symptoms today - tongue swelling - most likely are a reaction to Losartan (Cozaar).   Discuss a replacement for Losartan (Cozaar) with your regular care provider.

## 2021-07-02 NOTE — ED Triage Notes (Addendum)
Notified around 0430 that her tongue was swelling, pt had N/V as well. Took a Zofran. Also fell back in the RR, hit her L backside and reports pain. Thinks it is from the Zofran. Ate cantaloupe last night.

## 2021-07-02 NOTE — ED Provider Notes (Signed)
[facility] DEPT Provider Note   CSN: [id number] Arrival date & time: 07/02/21  1152     History No chief complaint on file.   Nancy Maddox is a 75 y.o. female.  75 year old female with prior medical history as detailed below presents for evaluation.  Patient complains of tongue swelling.  She reports onset of symptoms was at 430 this morning.  This awoke her from sleep.  She reports that the tongue feels swollen.  This has been persistent since 4:30 in the morning.  She denies significant worsening of her symptoms.  She denies associated shortness of breath.  She denies rash.  She denies prior episodes of similar symptoms.  She denies new medications except for Zofran over the last several days.  She is speaking in full sentences.  She is handling her own secretions.  She is denying any difficulty breathing.  Epi 0.3 IM given by staff prior to EDP evaluation.  The history is provided by the patient.  Illness Location:  Tongue swelling Severity:  Mild Onset quality:  Sudden Duration:  8 hours Timing:  Constant Progression:  Unchanged Chronicity:  New     Past Medical History:  Diagnosis Date   Anemia    Arthritis    Complication of anesthesia    patient states that she was given too much anesthesia, too quickly prior to a C-section and could not see for 48 hours   GERD (gastroesophageal reflux disease)    Headache    History of hiatal hernia    Hypertension    Pneumonia    PONV (postoperative nausea and vomiting)    Prediabetes    Pulmonary edema    in setting of pregnancy '78    Patient Active Problem List   Diagnosis Date Noted   Lumbosacral radiculopathy 06/21/2016   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   Unsteady gait 02/29/2016   Nausea alone 02/05/2013   Hypertension 02/03/2013   Left hand pain 02/03/2013   Cough 02/03/2013    Past Surgical History:  Procedure  Laterality Date   BREAST EXCISIONAL BIOPSY Right    CARPAL TUNNEL RELEASE  05/2017   CESAREAN SECTION     CHOLECYSTECTOMY N/A 11/03/2017   Procedure: LAPAROSCOPIC CHOLECYSTECTOMY WITH ATTEMPTED INTRAOPERATIVE CHOLANGIOGRAM;  Surgeon: Georganna Skeans, MD;  Location: [facility];  Service: General;  Laterality: N/A;   COLONOSCOPY WITH ESOPHAGOGASTRODUODENOSCOPY (EGD)     FEMUR FRACTURE SURGERY     UTERINE FIBROID SURGERY       OB History   No obstetric history on file.     Family History  Problem Relation Age of Onset   Asthma Son    CAD Sister    Diabetes Sister    Breast cancer Maternal Aunt    Hypertension Mother    Kidney disease Mother    Heart failure Mother    Heart attack Father    Colon cancer Cousin     Social History   Tobacco Use   Smoking status: Passive Smoke Exposure - Never Smoker   Smokeless tobacco: Never  Vaping Use   Vaping Use: Never used  Substance Use Topics   Alcohol use: No    Alcohol/week: 0.0 standard drinks   Drug use: No    Home Medications Prior to Admission medications   Medication Sig Start Date End Date Taking? Authorizing Provider  albuterol (VENTOLIN HFA) 108 (90 Base) MCG/ACT inhaler Inhale 2 puffs into the  lungs every 6 hours as needed for shortness of breath    [provider]  amLODipine (NORVASC) 10 MG tablet Take 10 mg by mouth daily.     [provider]  aspirin 81 MG EC tablet Take 81 mg by mouth daily.    [provider]  Calcium Carb-Cholecalciferol (CALTRATE 600+D3 SOFT) 600-800 MG-UNIT CHEW Caltrate 600 plus D  600 mg (1,500 mg)-800 unit chewable tablet  Take 1 tablet every day by oral route in the morning for 90 days.    [provider]  cyclobenzaprine (FLEXERIL) 5 MG tablet Take 1 tablet (5 mg total) by mouth 2 (two) times daily as needed for muscle spasms (pain). 02/20/17   Domenic Moras, PA-C  diclofenac Sodium (VOLTAREN) 1 % GEL Apply topically 4 (four) times daily.    [provider]  fluticasone (FLONASE) 50 MCG/ACT nasal spray Place 1 spray into both nostrils daily as needed for allergies.     [provider]  hydrochlorothiazide (HYDRODIURIL) 25 MG tablet Take 25 mg by mouth daily.    [provider]  hydrOXYzine (ATARAX/VISTARIL) 25 MG tablet Take 25 mg by mouth as needed for vomiting.     [provider]  ibuprofen (ADVIL,MOTRIN) 200 MG tablet Take 200 mg by mouth daily as needed for headache or moderate pain.    [provider]  Liniments (SALONPAS PAIN RELIEF PATCH EX) Apply 1 patch topically 2 (two) times daily as needed (pain).    [provider]  Loratadine 10 MG CAPS Take 1 capsule by mouth daily as needed.    [provider]  losartan (COZAAR) 100 MG tablet Take 100 mg by mouth daily.    [provider]  metoprolol succinate (TOPROL-XL) 25 MG 24 hr tablet Take 1 tablet (25 mg total) by mouth daily. Hold if systolic blood pressure (top blood pressure number) less than 100 mmHg or heart rate less than 60 bpm (pulse). 03/27/21 06/25/21  Rex Kras, DO  Multiple Vitamin (MULTIVITAMIN WITH MINERALS) TABS tablet Take 1 tablet by mouth daily.    [provider]  nitroGLYCERIN (NITROSTAT) 0.4 MG SL tablet Place 0.4 mg under the tongue as needed.    [provider]  omeprazole (PRILOSEC) 20 MG capsule Take 1 capsule (20 mg total) by mouth daily. 02/05/13   Bonnielee Haff, MD  ondansetron (ZOFRAN-ODT) 4 MG disintegrating tablet Take 1 tablet (4 mg total) by mouth 3 (three) times daily. 09/03/17   Levin Erp, PA  potassium chloride (MICRO-K) 10 MEQ CR capsule potassium chloride ER 10 mEq capsule,extended release    [provider]  Tetrahydrozoline HCl (VISINE OP) Apply 1 drop to eye daily as needed (allergies).    [provider]    Allergies    Patient has no known allergies.  Review of Systems   Review of Systems  All other systems reviewed and are  negative.  Physical Exam Updated Vital Signs BP (!) 155/97 (BP Location: Left Arm)   Pulse (!) 101   Temp 98.3 F (36.8 C) (Oral)   Resp 18   SpO2 100%   Physical Exam Vitals and nursing note reviewed.  Constitutional:      General: She is not in acute distress.    Appearance: Normal appearance. She is well-developed.  HENT:     Head: Normocephalic and atraumatic.     Mouth/Throat:     Comments: Mild edema noted of distal tongue.   Patient is handling her own secretions.  Normal speech. Eyes:     Conjunctiva/sclera: Conjunctivae normal.     Pupils: Pupils are equal, round, and reactive to light.  Cardiovascular:     Rate and Rhythm: Normal rate and regular rhythm.     Heart sounds: Normal heart sounds.  Pulmonary:     Effort: Pulmonary effort is normal. No respiratory distress.     Breath sounds: Normal breath sounds.  Abdominal:     General: There is no distension.     Palpations: Abdomen is soft.     Tenderness: There is no abdominal tenderness.  Musculoskeletal:        General: No deformity. Normal range of motion.     Cervical back: Normal range of motion and neck supple.  Skin:    General: Skin is warm and dry.  Neurological:     General: No focal deficit present.     Mental Status: She is alert and oriented to person, place, and time.    ED Results / Procedures / Treatments   Labs (all labs ordered are listed, but only abnormal results are displayed) Labs Reviewed - No data to display  EKG None  Radiology No results found.  Procedures Procedures   Medications Ordered in ED Medications  methylPREDNISolone sodium succinate (SOLU-MEDROL) 125 mg/2 mL injection 125 mg (has no administration in time range)  famotidine (PEPCID) IVPB 20 mg premix (has no administration in time range)  EPINEPHrine (EPI-PEN) injection 0.3 mg (0.3 mg Intramuscular Given 07/02/21 1201)    ED Course  I have reviewed the triage vital signs and the nursing  notes.  Pertinent labs & imaging results that were available during my care of the patient were reviewed by me and considered in my medical decision making (see chart for details).    MDM Rules/Calculators/A&P                           MDM  MSE complete  Nancy Maddox was evaluated in Emergency Department on 07/02/2021 for the symptoms described in the history of present illness. She was evaluated in the context of the global COVID-19 pandemic, which necessitated consideration that the patient might be at risk for infection with the SARS-CoV-2 virus that causes COVID-19. Institutional protocols and algorithms that pertain to the evaluation of patients at risk for COVID-19 are in a state of rapid change based on information released by regulatory bodies including the CDC and federal and state organizations. These policies and algorithms were followed during the patient's care in the ED.   Patient presented with complaint of mild tongue edema.  Symptoms began at 4 AM today.  Symptoms are without significant worsening during ED evaluation.  At time of discharge the patient with normal phonation.  She is without difficulty swallowing.  Edema of her tongue has not worsened.  Patient reports that she has been taking losartan correctly 5 years.  She has never had similar episodes before.  Patient is advised to stop taking losartan follow-up closely with her primary care provider - losartan is of concern for possible cause of her symptoms.  Patient will be prescribed short course of prednisone.  Importance of close follow-up is stressed.  Strict return precautions given and understood.   Final Clinical Impression(s) / ED Diagnoses Final diagnoses:  Angioedema, initial encounter    Rx / DC Orders ED Discharge Orders          Ordered    predniSONE (DELTASONE) 10 MG tablet  Daily        07/02/21 1359    EPINEPHrine 0.3 mg/0.3 mL IJ SOAJ injection  As needed        07/02/21 1359              Valarie Merino, MD 07/02/21 1402

## 2021-07-10 ENCOUNTER — Other Ambulatory Visit: Payer: Self-pay | Admitting: Internal Medicine

## 2021-07-10 ENCOUNTER — Other Ambulatory Visit: Payer: Self-pay

## 2021-07-10 ENCOUNTER — Ambulatory Visit
Admission: RE | Admit: 2021-07-10 | Discharge: 2021-07-10 | Disposition: A | Discharge status: Home / Self Care | Payer: Medicare HMO | Source: Ambulatory Visit | Attending: Internal Medicine | Admitting: Internal Medicine

## 2021-07-10 DIAGNOSIS — S2020XD Contusion of thorax, unspecified, subsequent encounter: Secondary | ICD-10-CM

## 2021-07-19 ENCOUNTER — Encounter: Payer: Self-pay | Admitting: Gastroenterology

## 2021-07-21 ENCOUNTER — Emergency Department (HOSPITAL_BASED_OUTPATIENT_CLINIC_OR_DEPARTMENT_OTHER)
Admission: EM | Admit: 2021-07-21 | Discharge: 2021-07-22 | Disposition: A | Discharge status: Home / Self Care | Payer: Medicare HMO | Attending: Emergency Medicine | Admitting: Emergency Medicine

## 2021-07-21 ENCOUNTER — Emergency Department (HOSPITAL_BASED_OUTPATIENT_CLINIC_OR_DEPARTMENT_OTHER): Payer: Medicare HMO

## 2021-07-21 ENCOUNTER — Other Ambulatory Visit: Payer: Self-pay

## 2021-07-21 ENCOUNTER — Encounter (HOSPITAL_BASED_OUTPATIENT_CLINIC_OR_DEPARTMENT_OTHER): Payer: Self-pay | Admitting: Emergency Medicine

## 2021-07-21 DIAGNOSIS — R109 Unspecified abdominal pain: Secondary | ICD-10-CM | POA: Insufficient documentation

## 2021-07-21 DIAGNOSIS — I10 Essential (primary) hypertension: Secondary | ICD-10-CM | POA: Insufficient documentation

## 2021-07-21 DIAGNOSIS — M79602 Pain in left arm: Secondary | ICD-10-CM | POA: Diagnosis present

## 2021-07-21 LAB — BASIC METABOLIC PANEL
Anion gap: 8 (ref 5–15)
BUN: 12 mg/dL (ref 8–23)
CO2: 29 mmol/L (ref 22–32)
Calcium: 10 mg/dL (ref 8.9–10.3)
Chloride: 103 mmol/L (ref 98–111)
Creatinine, Ser: 0.8 mg/dL (ref 0.44–1.00)
GFR, Estimated: >60 mL/min (ref >60)
Glucose, Bld: 103 mg/dL — ABNORMAL HIGH (ref 70–99)
Potassium: 3.6 mmol/L (ref 3.5–5.1)
Sodium: 140 mmol/L (ref 135–145)

## 2021-07-21 LAB — CBC
HCT: 41.7 % (ref 36.0–46.0)
Hemoglobin: 13.4 g/dL (ref 12.0–15.0)
MCH: 29.3 pg (ref 26.0–34.0)
MCHC: 32.1 g/dL (ref 30.0–36.0)
MCV: 91 fL (ref 80.0–100.0)
Platelets: 195 10*3/uL (ref 150–400)
RBC: 4.58 MIL/uL (ref 3.87–5.11)
RDW: 13.3 % (ref 11.5–15.5)
WBC: 3.5 10*3/uL — ABNORMAL LOW (ref 4.0–10.5)
nRBC: 0 % (ref 0.0–0.2)

## 2021-07-21 MED ORDER — ACETAMINOPHEN 325 MG PO TABS
650.0000 mg | ORAL_TABLET | Freq: Once | ORAL | Status: AC
  Administered 2021-07-21: 650 mg via ORAL
  Filled 2021-07-21: qty 2

## 2021-07-21 MED ORDER — CYCLOBENZAPRINE HCL 5 MG PO TABS: 5.0000 mg | ORAL_TABLET | Freq: Two times a day (BID) | ORAL | 0 refills | Status: AC | PRN

## 2021-07-21 MED ORDER — IOHEXOL 300 MG/ML  SOLN
100.0000 mL | Freq: Once | INTRAMUSCULAR | Status: AC | PRN
  Administered 2021-07-21: 100 mL via INTRAVENOUS

## 2021-07-21 NOTE — ED Notes (Signed)
Pt verbalizes understanding of discharge instructions. Opportunity for questioning and answers were provided. Armand removed by staff, pt discharged from ED to home. Educated to pick up Rx.  
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
## 2021-07-27 ENCOUNTER — Other Ambulatory Visit: Payer: Self-pay

## 2021-07-27 ENCOUNTER — Ambulatory Visit: Payer: Medicare HMO | Admitting: Cardiology

## 2021-07-27 ENCOUNTER — Encounter: Payer: Self-pay | Admitting: Cardiology

## 2021-07-27 VITALS — BP 139/84 | HR 73 | Resp 16 | Ht <= 58 in | Wt 154.2 lb

## 2021-07-27 DIAGNOSIS — R7303 Prediabetes: Secondary | ICD-10-CM

## 2021-07-27 DIAGNOSIS — R072 Precordial pain: Secondary | ICD-10-CM

## 2021-07-27 DIAGNOSIS — I10 Essential (primary) hypertension: Secondary | ICD-10-CM
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
## 2021-08-17 ENCOUNTER — Ambulatory Visit (INDEPENDENT_AMBULATORY_CARE_PROVIDER_SITE_OTHER): Payer: Medicare HMO | Admitting: Gastroenterology

## 2021-08-17 ENCOUNTER — Encounter: Payer: Self-pay | Admitting: Gastroenterology

## 2021-08-17 VITALS — BP 128/70 | HR 80 | Ht <= 58 in | Wt 154.5 lb

## 2021-08-17 DIAGNOSIS — R1012 Left upper quadrant pain: Secondary | ICD-10-CM

## 2021-08-17 DIAGNOSIS — Z86010 Personal history of colonic polyps: Secondary | ICD-10-CM | POA: Diagnosis not present

## 2021-08-17 DIAGNOSIS — K449 Diaphragmatic hernia without obstruction or gangrene: Secondary | ICD-10-CM | POA: Diagnosis not present

## 2021-08-17 DIAGNOSIS — R0789 Other chest pain: Secondary | ICD-10-CM

## 2021-08-17 DIAGNOSIS — R112 Nausea with vomiting, unspecified: Secondary | ICD-10-CM | POA: Diagnosis not present

## 2021-08-17 MED ORDER — DICYCLOMINE HCL 10 MG PO CAPS
10.0000 mg | ORAL_CAPSULE | Freq: Three times a day (TID) | ORAL | 3 refills | Status: DC
Start: 2021-08-17 — End: 2021-11-13

## 2021-08-17 MED ORDER — OMEPRAZOLE 40 MG PO CPDR
40.0000 mg | DELAYED_RELEASE_CAPSULE | Freq: Every day | ORAL | 3 refills | Status: AC
Start: 2021-08-17 — End: ?

## 2021-08-17 NOTE — Patient Instructions (Addendum)
You have been scheduled for an endoscopy and colonoscopy. Please follow the written instructions given to you at your visit today. Please pick up your prep supplies at the pharmacy within the next 1-3 days. If you use inhalers (even only as needed), please bring them with you on the day of your procedure.   Increase Omeprazole to 40 mg daily  Discontinue Omeprazole 20 mg  We will send Dicyclomine to your pharmacy  Follow up in 6 months  Due to recent changes in healthcare laws, you may see the results of your imaging and laboratory studies on MyChart before your provider has had a chance to review them.  We understand that in some cases there may be results that are confusing or concerning to you. Not all laboratory results come back in the same time frame and the provider may be waiting for multiple results in order to interpret others.  Please give [place] 48 hours in order for your provider to thoroughly review all the results before contacting the office for clarification of your results.    If you are age 49 or older, your body mass index should be between 23-30. Your Body mass index is 33.14 kg/m. If this is out of the aforementioned range listed, please consider follow up with your Primary Care Provider.  If you are age 60 or younger, your body mass index should be between 19-25. Your Body mass index is 33.14 kg/m. If this is out of the aformentioned range listed, please consider follow up with your Primary Care Provider.   ________________________________________________________  The [facility] providers would like to encourage you to use [facility] to communicate with providers for non-urgent requests or questions.  Due to long hold times on the telephone, sending your provider a message by [facility] may be a faster and more efficient way to get a response.  Please allow 48 business hours for a response.  Please remember that this is for non-urgent requests.   _______________________________________________________   I appreciate the  opportunity to care for you  Thank You   Harl Bowie , MD
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
## 2021-08-20 ENCOUNTER — Ambulatory Visit
Admission: RE | Admit: 2021-08-20 | Discharge: 2021-08-20 | Disposition: A | Discharge status: Home / Self Care | Payer: Medicare HMO | Source: Ambulatory Visit | Attending: Internal Medicine | Admitting: Internal Medicine

## 2021-08-20 DIAGNOSIS — Z1231 Encounter for screening mammogram for malignant neoplasm of breast: Secondary | ICD-10-CM

## 2021-08-22 ENCOUNTER — Other Ambulatory Visit: Payer: Self-pay | Admitting: Internal Medicine

## 2021-08-22 DIAGNOSIS — R928 Other abnormal and inconclusive findings on diagnostic imaging of breast: Secondary | ICD-10-CM

## 2021-09-28 ENCOUNTER — Ambulatory Visit
Admission: RE | Admit: 2021-09-28 | Discharge: 2021-09-28 | Disposition: A | Discharge status: Home / Self Care | Payer: Medicare HMO | Source: Ambulatory Visit | Attending: Internal Medicine | Admitting: Internal Medicine

## 2021-09-28 ENCOUNTER — Ambulatory Visit: Admission: RE | Admit: 2021-09-28 | Payer: Medicare HMO | Source: Ambulatory Visit

## 2021-09-28 DIAGNOSIS — R928 Other abnormal and inconclusive findings on diagnostic imaging of breast: Secondary | ICD-10-CM

## 2021-10-24 ENCOUNTER — Ambulatory Visit (AMBULATORY_SURGERY_CENTER): Payer: Medicare HMO | Admitting: Gastroenterology

## 2021-10-24 ENCOUNTER — Encounter: Payer: Self-pay | Admitting: Gastroenterology

## 2021-10-24 VITALS — BP 123/66 | HR 67 | Temp 97.8°F | Resp 16 | Ht <= 58 in | Wt 154.0 lb

## 2021-10-24 DIAGNOSIS — K635 Polyp of colon: Secondary | ICD-10-CM

## 2021-10-24 DIAGNOSIS — R1012 Left upper quadrant pain: Secondary | ICD-10-CM

## 2021-10-24 DIAGNOSIS — K449 Diaphragmatic hernia without obstruction or gangrene: Secondary | ICD-10-CM | POA: Diagnosis not present

## 2021-10-24 DIAGNOSIS — R0789 Other chest pain: Secondary | ICD-10-CM | POA: Diagnosis not present

## 2021-10-24 DIAGNOSIS — D120 Benign neoplasm of cecum: Secondary | ICD-10-CM

## 2021-10-24 DIAGNOSIS — Z86010 Personal history of colonic polyps: Secondary | ICD-10-CM

## 2021-10-24 MED ORDER — SODIUM CHLORIDE 0.9 % IV SOLN: 500.0000 mL | Freq: Once | INTRAVENOUS | Status: DC

## 2021-10-24 NOTE — Progress Notes (Signed)
VS by DT    

## 2021-10-24 NOTE — Progress Notes (Signed)
1405 Robinul 0.1 mg IV given due large amount of secretions upon assessment.  MD made aware, vss  

## 2021-10-24 NOTE — Progress Notes (Signed)
Report given to PACU, vss 

## 2021-10-24 NOTE — Op Note (Signed)
[facility] Patient Name: Nancy Maddox Procedure Date: 10/24/2021 1:57 PM MRN: [id number] Endoscopist: Mauri Pole , MD Age: 76 Referring MD:  Date of Birth: 12-Apr-1946 Gender: Female Account #: [id number] Procedure:                Upper GI endoscopy Indications:              Abdominal pain in the left upper quadrant,                            Esophageal reflux symptoms that persist despite                            appropriate therapy, Chest pain (non cardiac) Medicines:                Monitored Anesthesia Care Procedure:                Pre-Anesthesia Assessment:                           - Prior to the procedure, a History and Physical                            was performed, and patient medications and                            allergies were reviewed. The patient's tolerance of                            previous anesthesia was also reviewed. The risks                            and benefits of the procedure and the sedation                            options and risks were discussed with the patient.                            All questions were answered, and informed consent                            was obtained. Prior Anticoagulants: The patient has                            taken no previous anticoagulant or antiplatelet                            agents. ASA Grade Assessment: II - A patient with                            mild systemic disease. After reviewing the risks                            and benefits, the patient was deemed in  satisfactory condition to undergo the procedure.                           After obtaining informed consent, the endoscope was                            passed under direct vision. Throughout the                            procedure, the patient's blood pressure, pulse, and                            oxygen saturations were monitored continuously. The                            Endoscope was  introduced through the mouth, and                            advanced to the second part of duodenum. The upper                            GI endoscopy was accomplished without difficulty.                            The patient tolerated the procedure well. Scope In: Scope Out: Findings:                 The Z-line was regular and was found 33 cm from the                            incisors.                           A 7 cm hiatal hernia was present.                           The cardia and gastric fundus were normal on                            retroflexion.                           The stomach was normal.                           The examined duodenum was normal. Complications:            No immediate complications. Estimated Blood Loss:     Estimated blood loss: none. Impression:               - Z-line regular, 33 cm from the incisors.                           - 7 cm hiatal hernia.                           - Normal  stomach.                           - Normal examined duodenum.                           - No specimens collected. Recommendation:           - Patient has a contact number available for                            emergencies. The signs and symptoms of potential                            delayed complications were discussed with the                            patient. Return to normal activities tomorrow.                            Written discharge instructions were provided to the                            patient.                           - Resume previous diet.                           - Continue present medications.                           - Follow an antireflux regimen. Mauri Pole, MD 10/24/2021 2:38:54 PM This report has been signed electronically.

## 2021-10-24 NOTE — Progress Notes (Signed)
[facility] History and Physical   Primary Care Physician:  Audley Hose, MD   Reason for Procedure:  LUQ abd pain, atypical chest pain, GERD, history of advance adenomatous polyps  Plan:    EGD and colonoscopy with possible interventions as needed     HPI: Nancy Maddox is a very pleasant 76 y.o. female here for EGD for evaluation of LUQ abd pain, nausea and GERd symptoms, she is due for surveillance colonoscopy for h/o advanced adenomatous polyps.  Please refer to office visit note 08/17/21 for additional details.  The risks and benefits as well as alternatives of endoscopic procedure(s) have been discussed and reviewed. All questions answered. The patient agrees to proceed.    Past Medical History:  Diagnosis Date   Anemia    Anxiety    Arthritis    Complication of anesthesia    patient states that she was given too much anesthesia, too quickly prior to a C-section and could not see for 48 hours   Gallstones    GERD (gastroesophageal reflux disease)    Headache    History of hiatal hernia    HLD (hyperlipidemia)    Hypertension    Pneumonia    PONV (postoperative nausea and vomiting)    Prediabetes    Pulmonary edema    in setting of pregnancy '78    Past Surgical History:  Procedure Laterality Date   BREAST EXCISIONAL BIOPSY Right    CARPAL TUNNEL RELEASE Left 05/2017   CESAREAN SECTION     CHOLECYSTECTOMY N/A 11/03/2017   Procedure: LAPAROSCOPIC CHOLECYSTECTOMY WITH ATTEMPTED INTRAOPERATIVE CHOLANGIOGRAM;  Surgeon: Georganna Skeans, MD;  Location: [facility];  Service: General;  Laterality: N/A;   COLONOSCOPY WITH ESOPHAGOGASTRODUODENOSCOPY (EGD)     FEMUR FRACTURE SURGERY Left    UTERINE FIBROID SURGERY      Prior to Admission medications   Medication Sig Start Date End Date Taking? Authorizing Provider  albuterol (VENTOLIN HFA) 108 (90 Base) MCG/ACT inhaler Inhale 2 puffs into the lungs every 6 hours as needed for shortness of  breath Patient not taking: Reported on 08/17/2021    [provider]  amLODipine (NORVASC) 10 MG tablet Take 10 mg by mouth daily.     [provider]  aspirin 81 MG EC tablet Take 81 mg by mouth daily.    [provider]  Calcium Carb-Cholecalciferol 600-800 MG-UNIT CHEW Caltrate 600 plus D  600 mg (1,500 mg)-800 unit chewable tablet  Take 1 tablet every day by oral route in the morning for 90 days.    [provider]  cyclobenzaprine (FLEXERIL) 5 MG tablet Take 1 tablet (5 mg total) by mouth 2 (two) times daily as needed for muscle spasms (pain). 07/21/21   Truddie Hidden, MD  diclofenac Sodium (VOLTAREN) 1 % GEL Apply topically 4 (four) times daily.    [provider]  dicyclomine (BENTYL) 10 MG capsule Take 1 capsule (10 mg total) by mouth 3 (three) times daily before meals. As needed 08/17/21   Mauri Pole, MD  EPINEPHrine 0.3 mg/0.3 mL IJ SOAJ injection Inject 0.3 mg into the muscle as needed for anaphylaxis. Patient not taking: Reported on 08/17/2021 07/02/21   Valarie Merino, MD  fluticasone [facility]) 50 MCG/ACT nasal spray Place 1 spray into both nostrils daily as needed for allergies.     [provider]  hydrochlorothiazide (HYDRODIURIL) 25 MG tablet Take 25 mg by mouth daily.    [provider]  hydrOXYzine (ATARAX/VISTARIL) 25  MG tablet Take 25 mg by mouth as needed for vomiting.     [provider]  Liniments (SALONPAS PAIN RELIEF PATCH EX) Apply 1 patch topically 2 (two) times daily as needed (pain).    [provider]  Loratadine 10 MG CAPS Take 1 capsule by mouth daily as needed.    [provider]  metoprolol succinate (TOPROL-XL) 25 MG 24 hr tablet Take 1 tablet (25 mg total) by mouth daily. Hold if systolic blood pressure (top blood pressure number) less than 100 mmHg or heart rate less than 60 bpm (pulse). 03/27/21 07/27/21  Rex Kras, DO  Multiple Vitamin (MULTIVITAMIN WITH  MINERALS) TABS tablet Take 1 tablet by mouth daily.    [provider]  nitroGLYCERIN (NITROSTAT) 0.4 MG SL tablet Place 0.4 mg under the tongue as needed. Patient not taking: Reported on 08/17/2021    [provider]  omeprazole (PRILOSEC) 40 MG capsule Take 1 capsule (40 mg total) by mouth daily. 08/17/21   Mauri Pole, MD  ondansetron (ZOFRAN-ODT) 4 MG disintegrating tablet Take 1 tablet (4 mg total) by mouth 3 (three) times daily. 09/03/17   Levin Erp, PA  potassium chloride (MICRO-K) 10 MEQ CR capsule potassium chloride ER 10 mEq capsule,extended release    [provider]  rosuvastatin (CRESTOR) 10 MG tablet rosuvastatin 10 mg tablet  Take 1 tablet every day by oral route in the evening for 90 days.    [provider]  Tetrahydrozoline HCl (VISINE OP) Apply 1 drop to eye daily as needed (allergies).    [provider]    Current Outpatient Medications  Medication Sig Dispense Refill   albuterol (VENTOLIN HFA) 108 (90 Base) MCG/ACT inhaler Inhale 2 puffs into the lungs every 6 hours as needed for shortness of breath (Patient not taking: Reported on 08/17/2021)     amLODipine (NORVASC) 10 MG tablet Take 10 mg by mouth daily.      aspirin 81 MG EC tablet Take 81 mg by mouth daily.     Calcium Carb-Cholecalciferol 600-800 MG-UNIT CHEW Caltrate 600 plus D  600 mg (1,500 mg)-800 unit chewable tablet  Take 1 tablet every day by oral route in the morning for 90 days.     cyclobenzaprine (FLEXERIL) 5 MG tablet Take 1 tablet (5 mg total) by mouth 2 (two) times daily as needed for muscle spasms (pain). 15 tablet 0   diclofenac Sodium (VOLTAREN) 1 % GEL Apply topically 4 (four) times daily.     dicyclomine (BENTYL) 10 MG capsule Take 1 capsule (10 mg total) by mouth 3 (three) times daily before meals. As needed 90 capsule 3   EPINEPHrine 0.3 mg/0.3 mL IJ SOAJ injection Inject 0.3 mg into the muscle as needed for anaphylaxis. (Patient not  taking: Reported on 08/17/2021) 1 each 0   fluticasone (FLONASE) 50 MCG/ACT nasal spray Place 1 spray into both nostrils daily as needed for allergies.      hydrochlorothiazide (HYDRODIURIL) 25 MG tablet Take 25 mg by mouth daily.     hydrOXYzine (ATARAX/VISTARIL) 25 MG tablet Take 25 mg by mouth as needed for vomiting.      Liniments (SALONPAS PAIN RELIEF PATCH EX) Apply 1 patch topically 2 (two) times daily as needed (pain).     Loratadine 10 MG CAPS Take 1 capsule by mouth daily as needed.     metoprolol succinate (TOPROL-XL) 25 MG 24 hr tablet Take 1 tablet (25 mg total) by mouth daily. Hold if systolic blood  pressure (top blood pressure number) less than 100 mmHg or heart rate less than 60 bpm (pulse). 90 tablet 1   Multiple Vitamin (MULTIVITAMIN WITH MINERALS) TABS tablet Take 1 tablet by mouth daily.     nitroGLYCERIN (NITROSTAT) 0.4 MG SL tablet Place 0.4 mg under the tongue as needed. (Patient not taking: Reported on 08/17/2021)     omeprazole (PRILOSEC) 40 MG capsule Take 1 capsule (40 mg total) by mouth daily. 90 capsule 3   ondansetron (ZOFRAN-ODT) 4 MG disintegrating tablet Take 1 tablet (4 mg total) by mouth 3 (three) times daily. 90 tablet 0   potassium chloride (MICRO-K) 10 MEQ CR capsule potassium chloride ER 10 mEq capsule,extended release     rosuvastatin (CRESTOR) 10 MG tablet rosuvastatin 10 mg tablet  Take 1 tablet every day by oral route in the evening for 90 days.     Tetrahydrozoline HCl (VISINE OP) Apply 1 drop to eye daily as needed (allergies).     No current facility-administered medications for this visit.    Allergies as of 10/24/2021 - Review Complete 08/17/2021  Allergen Reaction Noted   Amoxicillin Swelling 07/02/2021   Ibuprofen Swelling 07/02/2021   Losartan Swelling 07/02/2021    Family History  Problem Relation Age of Onset   Hypertension Mother    Kidney disease Mother    Heart failure Mother    Heart attack Father    Colon polyps Father     Clotting disorder Father    Diabetes Father    CAD Sister    Breast cancer Sister    Clotting disorder Sister    Diabetes Sister    Diabetes Sister    Diabetes Sister    Heart disease Paternal Grandfather    Asthma Son    Breast cancer Maternal Aunt    Colon polyps Paternal Aunt        x 2   Prostate cancer Paternal Uncle    Colon cancer Cousin     Social History   Socioeconomic History   Marital status: Widowed    Spouse name: Not on file   Number of children: 3   Years of education: Not on file   Highest education level: Not on file  Occupational History   Occupation: retired  Tobacco Use   Smoking status: Never    Passive exposure: Yes   Smokeless tobacco: Never  Vaping Use   Vaping Use: Never used  Substance and Sexual Activity   Alcohol use: No    Alcohol/week: 0.0 standard drinks   Drug use: No   Sexual activity: Not Currently    Partners: Male  Other Topics Concern   Not on file  Social History Narrative   Lives with two sons who has a learning disability due to complications surrounding premature birth.   One son lives in [place].  Worked in Therapist, art.  Getting ready to retire.  Education: some college.   Social Determinants of Health   Financial Resource Strain: Not on file  Food Insecurity: Not on file  Transportation Needs: Not on file  Physical Activity: Not on file  Stress: Not on file  Social Connections: Not on file  Intimate Partner Violence: Not on file    Review of Systems:  All other review of systems negative except as mentioned in the HPI.  Physical Exam: Vital signs in last 24 hours: BP 114/66    Pulse 82    Temp 97.8 F (36.6 C)    Ht 4\' 9"  (1.448 m)  Wt 154 lb (69.9 kg)    SpO2 97%    BMI 33.33 kg/m  General:   Alert, NAD Lungs:  Clear .   Heart:  Regular rate and rhythm Abdomen:  Soft, nontender and nondistended. Neuro/Psych:  Alert and cooperative. Normal mood and affect. A and O x 3  Reviewed labs,  radiology imaging, old records and pertinent past GI work up  Patient is appropriate for planned procedure(s) and anesthesia in an ambulatory setting   K. Denzil Magnuson , MD [phone]

## 2021-10-24 NOTE — Patient Instructions (Addendum)
Handout on polyps, hemorrhoids, diverticulosis, and hiatal hernia provided.  Await pathology report.  No repeat colonoscopy due to age.  Follow an antireflux regimen.  Follow up in GI office in 2-3 months.   YOU HAD AN ENDOSCOPIC PROCEDURE TODAY AT [facility]:   Refer to the procedure report that was given to you for any specific questions about what was found during the examination.  If the procedure report does not answer your questions, please call your gastroenterologist to clarify.  If you requested that your care partner not be given the details of your procedure findings, then the procedure report has been included in a sealed envelope for you to review at your convenience later.  YOU SHOULD EXPECT: Some feelings of bloating in the abdomen. Passage of more gas than usual.  Walking can help get rid of the air that was put into your GI tract during the procedure and reduce the bloating. If you had a lower endoscopy (such as a colonoscopy or flexible sigmoidoscopy) you may notice spotting of blood in your stool or on the toilet paper. If you underwent a bowel prep for your procedure, you may not have a normal bowel movement for a few days.  Please Note:  You might notice some irritation and congestion in your nose or some drainage.  This is from the oxygen used during your procedure.  There is no need for concern and it should clear up in a day or so.  SYMPTOMS TO REPORT IMMEDIATELY:  Following lower endoscopy (colonoscopy or flexible sigmoidoscopy):  Excessive amounts of blood in the stool  Significant tenderness or worsening of abdominal pains  Swelling of the abdomen that is new, acute  Fever of 100F or higher  Following upper endoscopy (EGD)  Vomiting of blood or coffee ground material  New chest pain or pain under the shoulder blades  Painful or persistently difficult swallowing  New shortness of breath  Fever of 100F or higher  Black, tarry-looking  stools  For urgent or emergent issues, a gastroenterologist can be reached at any hour by calling [phone]. Do not use MyChart messaging for urgent concerns.    DIET:  We do recommend a small meal at first, but then you may proceed to your regular diet.  Drink plenty of fluids but you should avoid alcoholic beverages for 24 hours.  ACTIVITY:  You should plan to take it easy for the rest of today and you should NOT DRIVE or use heavy machinery until tomorrow (because of the sedation medicines used during the test).    FOLLOW UP: Our staff will call the number listed on your records 48-72 hours following your procedure to check on you and address any questions or concerns that you may have regarding the information given to you following your procedure. If we do not reach you, we will leave a message.  We will attempt to reach you two times.  During this call, we will ask if you have developed any symptoms of COVID 19. If you develop any symptoms (ie: fever, flu-like symptoms, shortness of breath, cough etc.) before then, please call [phone].  If you test positive for Covid 19 in the 2 weeks post procedure, please call and report this information to [place].    If any biopsies were taken you will be contacted by phone or by letter within the next 1-3 weeks.  Please call us at [phone] if you have not heard about the biopsies in 3 weeks.  SIGNATURES/CONFIDENTIALITY: You and/or your care partner have signed paperwork which will be entered into your electronic medical record.  These signatures attest to the fact that that the information above on your After Visit Summary has been reviewed and is understood.  Full responsibility of the confidentiality of this discharge information lies with you and/or your care-partner.

## 2021-10-24 NOTE — Op Note (Signed)
[facility] Patient Name: Nancy Maddox Procedure Date: 10/24/2021 1:52 PM MRN: [id number] Endoscopist: Mauri Pole , MD Age: 76 Referring MD:  Date of Birth: Sep 01, 1946 Gender: Female Account #: [id number] Procedure:                Colonoscopy Indications:              High risk colon cancer surveillance: Personal                            history of colonic polyps, High risk colon cancer                            surveillance: Personal history of adenoma (10 mm or                            greater in size), High risk colon cancer                            surveillance: Personal history of multiple (3 or                            more) adenomas Medicines:                Monitored Anesthesia Care Procedure:                Pre-Anesthesia Assessment:                           - Prior to the procedure, a History and Physical                            was performed, and patient medications and                            allergies were reviewed. The patient's tolerance of                            previous anesthesia was also reviewed. The risks                            and benefits of the procedure and the sedation                            options and risks were discussed with the patient.                            All questions were answered, and informed consent                            was obtained. Prior Anticoagulants: The patient has                            taken no previous anticoagulant or antiplatelet  agents. ASA Grade Assessment: II - A patient with                            mild systemic disease. After reviewing the risks                            and benefits, the patient was deemed in                            satisfactory condition to undergo the procedure.                           After obtaining informed consent, the colonoscope                            was passed under direct vision. Throughout the                             procedure, the patient's blood pressure, pulse, and                            oxygen saturations were monitored continuously. The                            PCF-HQ190L Colonoscope was introduced through the                            anus and advanced to the the cecum, identified by                            appendiceal orifice and ileocecal valve. The                            colonoscopy was performed without difficulty. The                            patient tolerated the procedure well. The quality                            of the bowel preparation was adequate to identify                            polyps 6 mm and larger in size. The ileocecal                            valve, appendiceal orifice, and rectum were                            photographed. Scope In: 2:13:50 PM Scope Out: 2:32:21 PM Scope Withdrawal Time: 0 hours 14 minutes 4 seconds  Total Procedure Duration: 0 hours 18 minutes 31 seconds  Findings:                 The perianal and digital rectal examinations were  normal.                           A 2 mm polyp was found in the cecum. The polyp was                            sessile. The polyp was removed with a cold snare.                            Resection and retrieval were complete.                           Scattered small and large-mouthed diverticula were                            found in the sigmoid colon and descending colon.                           Non-bleeding external and internal hemorrhoids were                            found during retroflexion. The hemorrhoids were                            medium-sized.                           The exam was otherwise without abnormality. Complications:            No immediate complications. Estimated Blood Loss:     Estimated blood loss was minimal. Impression:               - One 2 mm polyp in the cecum, removed with a cold                            snare.  Resected and retrieved.                           - Diverticulosis in the sigmoid colon and in the                            descending colon.                           - Non-bleeding external and internal hemorrhoids.                           - The examination was otherwise normal. Recommendation:           - Patient has a contact number available for                            emergencies. The signs and symptoms of potential                            delayed complications were discussed with the  patient. Return to normal activities tomorrow.                            Written discharge instructions were provided to the                            patient.                           - Resume previous diet.                           - Continue present medications.                           - Await pathology results.                           - No repeat colonoscopy due to age. Mauri Pole, MD 10/24/2021 2:41:50 PM This report has been signed electronically.

## 2021-10-24 NOTE — Progress Notes (Signed)
Called to room to assist during endoscopic procedure.  Patient ID and intended procedure confirmed with present staff. Received instructions for my participation in the procedure from the performing physician.  

## 2021-10-26 ENCOUNTER — Telehealth: Payer: Self-pay | Admitting: *Deleted

## 2021-10-26 NOTE — Telephone Encounter (Signed)
°  Follow up Call-  Call back number 10/24/2021  Post procedure Call Back phone  # [phone]  Permission to leave phone message Yes  Some recent data might be hidden     Patient questions:  Do you have a fever, pain , or abdominal swelling? No. Pain Score  0 *  Have you tolerated food without any problems? Yes.    Have you been able to return to your normal activities? Yes.    Do you have any questions about your discharge instructions: Diet   No. Medications  No. Follow up visit  No.  Do you have questions or concerns about your Care? No.  Actions: * If pain score is 4 or above: No action needed, pain <4. Pt did ask what she could take for "fullness" feeling in addition to her Omeprazole. She said she has taken Pepcid but is out of it.Pt said she would get more of that and we discussed she could take gas x if she felt like she was gassy.

## 2021-10-29 ENCOUNTER — Other Ambulatory Visit: Payer: Self-pay

## 2021-11-13 ENCOUNTER — Other Ambulatory Visit: Payer: Self-pay | Admitting: Gastroenterology

## 2021-11-13 ENCOUNTER — Encounter: Payer: Self-pay | Admitting: Gastroenterology

## 2021-12-28 ENCOUNTER — Encounter: Payer: Self-pay | Admitting: Gastroenterology

## 2021-12-28 ENCOUNTER — Ambulatory Visit: Payer: Medicare HMO | Admitting: Gastroenterology

## 2021-12-28 VITALS — BP 110/70 | HR 85 | Ht 61.0 in | Wt 161.0 lb

## 2021-12-28 DIAGNOSIS — R1013 Epigastric pain: Secondary | ICD-10-CM

## 2021-12-28 DIAGNOSIS — K219 Gastro-esophageal reflux disease without esophagitis: Secondary | ICD-10-CM

## 2021-12-28 DIAGNOSIS — K449 Diaphragmatic hernia without obstruction or gangrene: Secondary | ICD-10-CM

## 2021-12-28 DIAGNOSIS — K582 Mixed irritable bowel syndrome: Secondary | ICD-10-CM | POA: Diagnosis not present
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
## 2021-12-28 NOTE — Patient Instructions (Signed)
We have sent the following medications to your pharmacy for you to pick up at your convenience:  Omeprazole 40 mg  Dicyclomine  ? ?Use FDgard three times a day as needed ? ?Follow up in 6 months ? ?If you are age 74 or older, your body mass index should be between 23-30. Your Body mass index is 30.42 kg/m?Marland Kitchen If this is out of the aforementioned range listed, please consider follow up with your Primary Care Provider. ? ?If you are age 59 or younger, your body mass index should be between 19-25. Your Body mass index is 30.42 kg/m?Marland Kitchen If this is out of the aformentioned range listed, please consider follow up with your Primary Care Provider.  ? ?________________________________________________________ ? ?The [facility] providers would like to encourage you to use [facility] to communicate with providers for non-urgent requests or questions.  Due to long hold times on the telephone, sending your provider a message by [facility] may be a faster and more efficient way to get a response.  Please allow 48 business hours for a response.  Please remember that this is for non-urgent requests.  ?_______________________________________________________  ? ?I appreciate the  opportunity to care for you ? ?Thank You  ? ?Harl Bowie , MD  ?

## 2022-04-25 ENCOUNTER — Other Ambulatory Visit: Payer: Self-pay | Admitting: Internal Medicine

## 2022-04-25 DIAGNOSIS — M810 Age-related osteoporosis without current pathological fracture: Secondary | ICD-10-CM

## 2022-04-25 DIAGNOSIS — Z1231 Encounter for screening mammogram for malignant neoplasm of breast: Secondary | ICD-10-CM

## 2022-07-29 ENCOUNTER — Ambulatory Visit: Payer: Medicare HMO | Admitting: Cardiology

## 2022-10-18 ENCOUNTER — Ambulatory Visit
Admission: RE | Admit: 2022-10-18 | Discharge: 2022-10-18 | Disposition: A | Discharge status: Home / Self Care | Payer: Medicare HMO | Source: Ambulatory Visit | Attending: Internal Medicine | Admitting: Internal Medicine

## 2022-10-18 DIAGNOSIS — M810 Age-related osteoporosis without current pathological fracture: Secondary | ICD-10-CM

## 2022-10-18 DIAGNOSIS — Z1231 Encounter for screening mammogram for malignant neoplasm of breast: Secondary | ICD-10-CM

## 2023-01-01 ENCOUNTER — Encounter: Payer: Self-pay | Admitting: Physical Therapy

## 2023-01-01 ENCOUNTER — Ambulatory Visit: Payer: Medicare HMO | Attending: Family Medicine | Admitting: Physical Therapy

## 2023-01-01 DIAGNOSIS — R2689 Other abnormalities of gait and mobility: Secondary | ICD-10-CM | POA: Diagnosis present

## 2023-01-01 DIAGNOSIS — R2681 Unsteadiness on feet: Secondary | ICD-10-CM | POA: Diagnosis present

## 2023-01-01 DIAGNOSIS — M6281 Muscle weakness (generalized): Secondary | ICD-10-CM | POA: Diagnosis present

## 2023-01-01 DIAGNOSIS — R262 Difficulty in walking, not elsewhere classified: Secondary | ICD-10-CM | POA: Insufficient documentation

## 2023-01-01 NOTE — Therapy (Signed)
OUTPATIENT PHYSICAL THERAPY NEURO EVALUATION   Patient Name: Nancy Maddox MRN: [id number] DOB:1945-10-25, 77 y.o., female 54 Date: 01/01/2023   PCP: Audley Hose, MD  REFERRING PROVIDER: Eben Burow, NP   END OF SESSION:  PT End of Session - 01/01/23 1434     Visit Number 1    Number of Visits 9    Date for PT Re-Evaluation 03/02/23    Authorization Type [employer]    PT Start Time 1324   pt late to eval   PT Stop Time 1400    PT Time Calculation (min) 36 min    Equipment Utilized During Treatment Gait belt    Activity Tolerance Patient tolerated treatment well    Behavior During Therapy WFL for tasks assessed/performed             Past Medical History:  Diagnosis Date   Anemia    Anxiety    Arthritis    Complication of anesthesia    patient states that she was given too much anesthesia, too quickly prior to a C-section and could not see for 48 hours   Gallstones    GERD (gastroesophageal reflux disease)    Headache    History of hiatal hernia    HLD (hyperlipidemia)    Hypertension    Pneumonia    PONV (postoperative nausea and vomiting)    Prediabetes    Pulmonary edema    in setting of pregnancy '78   Past Surgical History:  Procedure Laterality Date   BREAST EXCISIONAL BIOPSY Right    CARPAL TUNNEL RELEASE Left 05/2017   CESAREAN SECTION     CHOLECYSTECTOMY N/A 11/03/2017   Procedure: LAPAROSCOPIC CHOLECYSTECTOMY WITH ATTEMPTED INTRAOPERATIVE CHOLANGIOGRAM;  Surgeon: Georganna Skeans, MD;  Location: [facility];  Service: General;  Laterality: N/A;   COLONOSCOPY WITH ESOPHAGOGASTRODUODENOSCOPY (EGD)     FEMUR FRACTURE SURGERY Left    UTERINE FIBROID SURGERY     Patient Active Problem List   Diagnosis Date Noted   Lumbosacral radiculopathy 06/21/2016   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 [place] forwards going down the stairs. Had a bruise on her knees. Also almost had a fall when bending down to get something off the floor and almost fell into the cabinet. Has been using a cane on and off for about 6 years. Stairs are challenging. Reports if she sits too long, then legs will feel numb.   Pt accompanied by: self  PERTINENT HISTORY: PMH: Anemia, Anxiety, Arthritis, HLD, HTN, prediabetes, stable angina, hiatal hernia   PAIN:  Are you having pain? No Just some swelling in her hands   PRECAUTIONS: Fall  WEIGHT BEARING RESTRICTIONS: No  FALLS: Has patient fallen in last 6 months? Yes. Number of falls 1, and 3 almost falls   LIVING ENVIRONMENT: Lives with: lives with their son, (son is disabled, pt is son's primary caregiver) Lives in: House/apartment Stairs: Yes: External: 4 steps; on left going up, broke one of the other railings after a fall, looking into getting a new one. 2 steps in the back with a railing  Has following equipment at home: Single point cane, Walker - 4 wheeled, shower chair, and multiple canes, SPC with 4 prong tip  Sometimes uses rollator in the morning.   PLOF: Independent and Independent with community mobility with device  PATIENT GOALS: Wants to learn some exercises, get stronger   OBJECTIVE:    COGNITION: Overall cognitive status: Within functional limits for tasks assessed   SENSATION: Light touch: WFL Pt reports some numbness/tingling in her feet. As well as gets numbness and swelling in R lower leg.   COORDINATION: Heel to shin: WFL, soreness in R knee    POSTURE: rounded shoulders, forward head, increased thoracic kyphosis, posterior pelvic tilt, and flexed trunk   LOWER EXTREMITY ROM:     Pt with decr knee extension AROM in sitting RLE>LLE  LOWER EXTREMITY MMT:    MMT Right Eval Left Eval  Hip flexion 4 4-  Hip extension    Hip abduction 3+ 3+  Hip adduction 5 5  Hip internal rotation    Hip external rotation    Knee flexion 4+ 5  Knee extension 4 4  Ankle dorsiflexion 5 5  Ankle plantarflexion    Ankle inversion    Ankle eversion    (Blank rows = not tested)  All tested in sitting.   TRANSFERS: Assistive device utilized: None  Sit to stand: SBA Stand to sit: SBA Can perform without UE support, hands on knees, BLE bracing against chair  Primarily performs with UE support   GAIT: Gait pattern: step through pattern, decreased arm swing- Left, decreased stride length, decreased hip/knee flexion- Right, decreased hip/knee flexion- Left, decreased ankle dorsiflexion- Right, decreased ankle dorsiflexion- Left, Right foot flat, Left foot flat, and trunk flexed Distance walked: Clinic distances  Assistive device utilized: Single point cane with 4 prong tip Level of assistance: SBA Comments: Pt with incr neck flexion and looks down at floor during gait   FUNCTIONAL TESTS:  5 times sit to stand: 25 seconds no UE support with hands on knees, BLE bracing against chair  Timed up and go (TUG): 21.62 seconds with SPC with 4 prong tip  10 meter walk test: 21.8 seconds = 1.50 ft/sec with SPC with 4 prong tip    TODAY'S TREATMENT:  N/A  during eval.   PATIENT EDUCATION: Education details: Clinical findings, POC, importance of using her cane at all time due to fall risk (pt reports furniture walking in the house) Person educated: Patient Education method: Explanation Education comprehension: verbalized understanding  HOME EXERCISE PROGRAM: Will provide at future session.  GOALS: Goals reviewed with patient? Yes  SHORT TERM GOALS: Target date: 01/29/2023  BERG to be assessed with LTG written. Baseline: Goal status: INITIAL  2.  Pt will verbalize understanding of fall prevention. Baseline:  Goal status: INITIAL  3.  Pt will improve gait speed with LRAD to at least 1.8 ft/sec in order to demo improved community mobility.  Baseline: 21.8 seconds = 1.50 ft/sec with SPC with 4 prong tip  Goal status: INITIAL  4.  Pt will improve 5x sit<>stand to less than or equal to 22 sec to demonstrate improved functional strength and transfer efficiency.  Baseline: 25 seconds no UE support with hands on knees, BLE bracing against chair Goal status: INITIAL    LONG TERM GOALS: Target date: 02/26/2023  Pt will be independent with final HEP in order to build upon functional gains made in therapy. Baseline:  Goal status: INITIAL  2.  BERG goal to be written. Baseline:  Goal status: INITIAL  3.  Pt will improve gait speed with LRAD to at least 2.1 ft/sec in order to demo improved community mobility.  Baseline:  21.8 seconds = 1.50 ft/sec with SPC with 4 prong tip  Goal status: INITIAL  4.  Pt will improve 5x sit<>stand to less than or equal to 18 sec to demonstrate improved functional strength and transfer efficiency.  Baseline:  25 seconds no UE support with hands on knees, BLE bracing against chair Goal status: INITIAL  5.  Pt will improve TUG time to 17 seconds or less in order to demo decrease fall risk. Baseline:  21.62 seconds with SPC with 4 prong tip  Goal status: INITIAL    ASSESSMENT:  CLINICAL  IMPRESSION: Patient is a 77 year old female referred to Neuro OPPT for hx of falling.   Pt's PMH is significant for: Anemia, Anxiety, Arthritis, HLD, HTN, prediabetes, stable angina. The following deficits were present during the exam: postural abnormalities, gait abnormalities, decr strength, decr activity tolerance, impaired balance, impaired sensation, decr ROM. Based on TUG, 5x sit <> stand, and gait speed pt is an incr risk for falls. Pt also has a hx of falls. Pt would benefit from skilled PT to address these impairments and functional limitations to maximize functional mobility independence and decr fall risk.   OBJECTIVE IMPAIRMENTS: Abnormal gait, decreased activity tolerance, decreased balance, decreased coordination, decreased endurance, decreased mobility, difficulty walking, decreased ROM, decreased strength, decreased safety awareness, impaired flexibility, impaired sensation, and postural dysfunction.   ACTIVITY LIMITATIONS: carrying, lifting, squatting, stairs, transfers, and locomotion level  PARTICIPATION LIMITATIONS: shopping, community activity, and yard work  PERSONAL FACTORS: Age, Behavior pattern, Past/current experiences, Time since onset of injury/illness/exacerbation, and 3+ comorbidities: Anemia, Anxiety, Arthritis, HLD, HTN, prediabetes, stable angina  are also affecting patient's functional outcome.   REHAB POTENTIAL: Good  CLINICAL DECISION MAKING: Evolving/moderate complexity  EVALUATION COMPLEXITY: Moderate  PLAN:  PT FREQUENCY: 1x/week  PT DURATION: 8 weeks  PLANNED INTERVENTIONS: Therapeutic exercises, Therapeutic activity, Neuromuscular re-education, Balance training, Gait training, Patient/Family education, Self Care, Stair training, Vestibular training, DME instructions, and Re-evaluation  PLAN FOR NEXT SESSION: assess BERG. Initial HEP for sit <> stands, hip ABD strength, functional strength and balance.  Arliss Journey, PT, DPT  01/01/2023, 2:35  PM

## 2023-01-07 ENCOUNTER — Ambulatory Visit: Payer: Medicare HMO | Admitting: Physical Therapy

## 2023-01-15 ENCOUNTER — Ambulatory Visit: Payer: Medicare HMO | Admitting: Physical Therapy

## 2023-01-15 ENCOUNTER — Encounter: Payer: Self-pay | Admitting: Physical Therapy

## 2023-01-15 VITALS — BP 126/71 | HR 72

## 2023-01-15 DIAGNOSIS — R262 Difficulty in walking, not elsewhere classified: Secondary | ICD-10-CM

## 2023-01-15 DIAGNOSIS — M6281 Muscle weakness (generalized): Secondary | ICD-10-CM

## 2023-01-15 DIAGNOSIS — R2681 Unsteadiness on feet: Secondary | ICD-10-CM

## 2023-01-15 DIAGNOSIS — R2689 Other abnormalities of gait and mobility: Secondary | ICD-10-CM

## 2023-01-15 NOTE — Therapy (Signed)
OUTPATIENT PHYSICAL THERAPY NEURO TREATMENT   Patient Name: Nancy Maddox MRN: [id number] DOB:September 19, 1946, 77 y.o., female Today's Date: 01/15/2023   PCP: Harvest Forest, MD  REFERRING PROVIDER: Loney Laurence, NP   END OF SESSION:  PT End of Session - 01/15/23 1348     Visit Number 2    Number of Visits 9    Date for PT Re-Evaluation 03/02/23    Authorization Type [employer] - 9 visits 01/01/23 - 02/26/23    PT Start Time 1345    PT Stop Time 1430    PT Time Calculation (min) 45 min    Equipment Utilized During Treatment Gait belt    Activity Tolerance Patient tolerated treatment well    Behavior During Therapy WFL for tasks assessed/performed             Past Medical History:  Diagnosis Date   Anemia    Anxiety    Arthritis    Complication of anesthesia    patient states that she was given too much anesthesia, too quickly prior to a C-section and could not see for 48 hours   Gallstones    GERD (gastroesophageal reflux disease)    Headache    History of hiatal hernia    HLD (hyperlipidemia)    Hypertension    Pneumonia    PONV (postoperative nausea and vomiting)    Prediabetes    Pulmonary edema    in setting of pregnancy '78   Past Surgical History:  Procedure Laterality Date   BREAST EXCISIONAL BIOPSY Right    CARPAL TUNNEL RELEASE Left 05/2017   CESAREAN SECTION     CHOLECYSTECTOMY N/A 11/03/2017   Procedure: LAPAROSCOPIC CHOLECYSTECTOMY WITH ATTEMPTED INTRAOPERATIVE CHOLANGIOGRAM;  Surgeon: Violeta Gelinas, MD;  Location: [facility] OR;  Service: General;  Laterality: N/A;   COLONOSCOPY WITH ESOPHAGOGASTRODUODENOSCOPY (EGD)     FEMUR FRACTURE SURGERY Left    UTERINE FIBROID SURGERY     Patient Active Problem List   Diagnosis Date Noted   Lumbosacral radiculopathy 06/21/2016   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   Unsteady gait 02/29/2016   Nausea alone 02/05/2013   Hypertension 02/03/2013    Left hand pain 02/03/2013   Cough 02/03/2013    ONSET DATE: 12/09/2022  REFERRING DIAG: Z91.81 (ICD-10-CM) - History of falling   THERAPY DIAG:  Muscle weakness (generalized)  Difficulty in walking, not elsewhere classified  Other abnormalities of gait and mobility  Unsteadiness on feet  Rationale for Evaluation and Treatment: Rehabilitation  SUBJECTIVE:  SUBJECTIVE STATEMENT: Missed last week's appt due to transportation. Caught the bus to today's appt. No falls, just went she was on the bus, bumped her L leg.   Pt accompanied by: self  PERTINENT HISTORY: PMH: Anemia, Anxiety, Arthritis, HLD, HTN, prediabetes, stable angina, hiatal hernia   PAIN:  Are you having pain? Yes: NPRS scale: 5/10 Pain location: L leg Pain description: Throbbing Aggravating factors: Going up and down a lot of stairs  Relieving factors: putting cream on it     Vitals:   01/15/23 1355  BP: 126/71  Pulse: 72     PRECAUTIONS: Fall  WEIGHT BEARING RESTRICTIONS: No  FALLS: Has patient fallen in last 6 months? Yes. Number of falls 1, and 3 almost falls   LIVING ENVIRONMENT: Lives with: lives with their son, (son is disabled, pt is son's primary caregiver) Lives in: House/apartment Stairs: Yes: External: 4 steps; on left going up, broke one of the other railings after a fall, looking into getting a new one. 2 steps in the back with a railing  Has following equipment at home: Single point cane, Walker - 4 wheeled, shower chair, and multiple canes, SPC with 4 prong tip  Sometimes uses rollator in the morning.   PLOF: Independent and Independent with community mobility with device  PATIENT GOALS: Wants to learn some exercises, get stronger   OBJECTIVE:   COGNITION: Overall cognitive status: Within  functional limits for tasks assessed   SENSATION: Light touch: WFL Pt reports some numbness/tingling in her feet. As well as gets numbness and swelling in R lower leg.   COORDINATION: Heel to shin: WFL, soreness in R knee    POSTURE: rounded shoulders, forward head, increased thoracic kyphosis, posterior pelvic tilt, and flexed trunk   LOWER EXTREMITY ROM:     Pt with decr knee extension AROM in sitting RLE>LLE  LOWER EXTREMITY MMT:    MMT Right Eval Left Eval  Hip flexion 4 4-  Hip extension    Hip abduction 3+ 3+  Hip adduction 5 5  Hip internal rotation    Hip external rotation    Knee flexion 4+ 5  Knee extension 4 4  Ankle dorsiflexion 5 5  Ankle plantarflexion    Ankle inversion    Ankle eversion    (Blank rows = not tested)  All tested in sitting.   TRANSFERS: Assistive device utilized: None  Sit to stand: SBA Stand to sit: SBA Can perform without UE support, hands on knees, BLE bracing against chair  Primarily performs with UE support   GAIT: Gait pattern: step through pattern, decreased arm swing- Left, decreased stride length, decreased hip/knee flexion- Right, decreased hip/knee flexion- Left, decreased ankle dorsiflexion- Right, decreased ankle dorsiflexion- Left, Right foot flat, Left foot flat, and trunk flexed Distance walked: Clinic distances  Assistive device utilized: Single point cane with 4 prong tip Level of assistance: SBA Comments: Pt with incr neck flexion and looks down at floor during gait   FUNCTIONAL TESTS:  5 times sit to stand: 25 seconds no UE support with hands on knees, BLE bracing against chair  Timed up and go (TUG): 21.62 seconds with SPC with 4 prong tip  10 meter walk test: 21.8 seconds = 1.50 ft/sec with SPC with 4 prong tip    TODAY'S TREATMENT:  [facility] PT Assessment - 01/15/23 1356        Standardized Balance Assessment   Standardized Balance Assessment Berg Balance Test      Berg Balance Test   Sit to Stand Able to stand  independently using hands    Standing Unsupported Able to stand safely 2 minutes    Sitting with Back Unsupported but Feet Supported on Floor or Stool Able to sit safely and securely 2 minutes    Stand to Sit Controls descent by using hands    Transfers Able to transfer safely, minor use of hands    Standing Unsupported with Eyes Closed Able to stand 10 seconds safely    Standing Unsupported with Feet Together Able to place feet together independently and stand 1 minute safely    From Standing, Reach Forward with Outstretched Arm Can reach forward >12 cm safely (5")    From Standing Position, Pick up Object from Floor Unable to pick up and needs supervision    From Standing Position, Turn to Look Behind Over each Shoulder Looks behind one side only/other side shows less weight shift    Turn 360 Degrees Able to turn 360 degrees safely but slowly   To R: 12.6 seconds, to L: 11.69 SECONDS   Standing Unsupported, Alternately Place Feet on Step/Stool Able to complete 4 steps without aid or supervision   44 seconds   Standing Unsupported, One Foot in Front Able to plae foot ahead of the other independently and hold 30 seconds    Standing on One Leg Able to lift leg independently and hold equal to or more than 3 seconds   4.5 seconds on LLE   Total Score 42    Berg comment: 42/56 = Significant Fall Risk            Access Code: [email or web address] URL: [email or web address] Date: 01/15/2023 Prepared by: Sherlie Ban  Initiated HEP, see MedBridge for more details   Exercises - Sit to Stand  - 2 x daily - 5 x weekly - 1 sets - 10 reps - trying to perform without UE support, pt only able to perform 5 reps before needing UE support  - Seated Hamstring Stretch  - 1-2 x daily - 5 x weekly - 3 sets - 30 hold - Standing March with Counter Support  - 2 x  daily - 5 x weekly - 1-2 sets - 10 reps - Standing Hip Abduction with Unilateral Counter Support  - 2 x daily - 5 x weekly - 1-2 sets - 10 reps - Supine Bridge  - 1-2 x daily - 5 x weekly - 1-2 sets - 10 reps   PATIENT EDUCATION: Education details: Results of BERG, initial HEP for strength, ROM, balance Person educated: Patient Education method: Explanation, Demonstration, Verbal cues, and Handouts Education comprehension: verbalized understanding and returned demonstration  HOME EXERCISE PROGRAM: Access Code: [email or web address] URL: [email or web address] Date: 01/15/2023 Prepared by: Sherlie Ban  Exercises - Sit to Stand  - 2 x daily - 5 x weekly - 1 sets - 10 reps - Seated Hamstring Stretch  - 1-2 x daily - 5 x weekly - 3 sets - 30 hold - Standing March with Counter Support  - 2 x daily - 5 x weekly - 1-2 sets - 10 reps - Standing Hip Abduction with Unilateral Counter Support  - 2 x daily - 5 x weekly - 1-2 sets - 10 reps - Supine Bridge  - 1-2 x daily - 5 x weekly - 1-2 sets -  10 reps  GOALS: Goals reviewed with patient? Yes  SHORT TERM GOALS: Target date: 01/29/2023  BERG to be assessed with LTG written. Baseline: 42/56 Goal status: MET  2.  Pt will verbalize understanding of fall prevention. Baseline:  Goal status: INITIAL  3.  Pt will improve gait speed with LRAD to at least 1.8 ft/sec in order to demo improved community mobility.  Baseline: 21.8 seconds = 1.50 ft/sec with SPC with 4 prong tip  Goal status: INITIAL  4.  Pt will improve 5x sit<>stand to less than or equal to 22 sec to demonstrate improved functional strength and transfer efficiency.  Baseline: 25 seconds no UE support with hands on knees, BLE bracing against chair Goal status: INITIAL    LONG TERM GOALS: Target date: 02/26/2023  Pt will be independent with final HEP in order to build upon functional gains made in therapy. Baseline:  Goal status: INITIAL  2.  Pt will improve BERG to at  least a 47/56 in order to demo decr fall risk. Baseline: 42/56 Goal status: INITIAL  3.  Pt will improve gait speed with LRAD to at least 2.1 ft/sec in order to demo improved community mobility.  Baseline:  21.8 seconds = 1.50 ft/sec with SPC with 4 prong tip  Goal status: INITIAL  4.  Pt will improve 5x sit<>stand to less than or equal to 18 sec to demonstrate improved functional strength and transfer efficiency.  Baseline:  25 seconds no UE support with hands on knees, BLE bracing against chair Goal status: INITIAL  5.  Pt will improve TUG time to 17 seconds or less in order to demo decrease fall risk. Baseline:  21.62 seconds with SPC with 4 prong tip  Goal status: INITIAL    ASSESSMENT:  CLINICAL IMPRESSION: Assessed the BERG with pt scoring a 42/56, indicating a significant risk for falls with LTG written. Remainder of session focused on initiating HEP for functional strength, balance, and hamstring AROM. Pt challenged by sit <> stands without UE support and has decr eccentric control. Pt tolerated session well, will continue to progress towards LTGs.    OBJECTIVE IMPAIRMENTS: Abnormal gait, decreased activity tolerance, decreased balance, decreased coordination, decreased endurance, decreased mobility, difficulty walking, decreased ROM, decreased strength, decreased safety awareness, impaired flexibility, impaired sensation, and postural dysfunction.   ACTIVITY LIMITATIONS: carrying, lifting, squatting, stairs, transfers, and locomotion level  PARTICIPATION LIMITATIONS: shopping, community activity, and yard work  PERSONAL FACTORS: Age, Behavior pattern, Past/current experiences, Time since onset of injury/illness/exacerbation, and 3+ comorbidities: Anemia, Anxiety, Arthritis, HLD, HTN, prediabetes, stable angina  are also affecting patient's functional outcome.   REHAB POTENTIAL: Good  CLINICAL DECISION MAKING: Evolving/moderate complexity  EVALUATION COMPLEXITY:  Moderate  PLAN:  PT FREQUENCY: 1x/week  PT DURATION: 8 weeks  PLANNED INTERVENTIONS: Therapeutic exercises, Therapeutic activity, Neuromuscular re-education, Balance training, Gait training, Patient/Family education, Self Care, Stair training, Vestibular training, DME instructions, and Re-evaluation  PLAN FOR NEXT SESSION: Work on sit <> stands, hip ABD strength, functional strength and balance. SciFit    Drake Leach, PT, DPT  01/15/2023, 2:40 PM

## 2023-01-22 ENCOUNTER — Ambulatory Visit: Payer: Medicare HMO | Admitting: Physical Therapy

## 2023-01-29 ENCOUNTER — Encounter: Payer: Self-pay | Admitting: Physical Therapy

## 2023-01-29 ENCOUNTER — Ambulatory Visit: Payer: Medicare HMO | Attending: Family Medicine | Admitting: Physical Therapy

## 2023-01-29 DIAGNOSIS — M6281 Muscle weakness (generalized): Secondary | ICD-10-CM | POA: Diagnosis present

## 2023-01-29 DIAGNOSIS — R2689 Other abnormalities of gait and mobility: Secondary | ICD-10-CM | POA: Diagnosis present

## 2023-01-29 DIAGNOSIS — R262 Difficulty in walking, not elsewhere classified: Secondary | ICD-10-CM | POA: Diagnosis present

## 2023-01-29 DIAGNOSIS — R2681 Unsteadiness on feet: Secondary | ICD-10-CM

## 2023-01-29 NOTE — Therapy (Signed)
OUTPATIENT PHYSICAL THERAPY NEURO TREATMENT   Patient Name: Nancy Maddox MRN: [id number] DOB:1946-09-28, 77 y.o., female Today's Date: 01/29/2023   PCP: Harvest Forest, MD  REFERRING PROVIDER: Loney Laurence, NP   END OF SESSION:  PT End of Session - 01/29/23 1322     Visit Number 3    Number of Visits 9    Date for PT Re-Evaluation 03/02/23    Authorization Type [employer] - 9 visits 01/01/23 - 02/26/23    PT Start Time 1320    PT Stop Time 1400    PT Time Calculation (min) 40 min    Equipment Utilized During Treatment Gait belt    Activity Tolerance Patient tolerated treatment well    Behavior During Therapy WFL for tasks assessed/performed             Past Medical History:  Diagnosis Date   Anemia    Anxiety    Arthritis    Complication of anesthesia    patient states that she was given too much anesthesia, too quickly prior to a C-section and could not see for 48 hours   Gallstones    GERD (gastroesophageal reflux disease)    Headache    History of hiatal hernia    HLD (hyperlipidemia)    Hypertension    Pneumonia    PONV (postoperative nausea and vomiting)    Prediabetes    Pulmonary edema    in setting of pregnancy '78   Past Surgical History:  Procedure Laterality Date   BREAST EXCISIONAL BIOPSY Right    CARPAL TUNNEL RELEASE Left 05/2017   CESAREAN SECTION     CHOLECYSTECTOMY N/A 11/03/2017   Procedure: LAPAROSCOPIC CHOLECYSTECTOMY WITH ATTEMPTED INTRAOPERATIVE CHOLANGIOGRAM;  Surgeon: Violeta Gelinas, MD;  Location: [facility] OR;  Service: General;  Laterality: N/A;   COLONOSCOPY WITH ESOPHAGOGASTRODUODENOSCOPY (EGD)     FEMUR FRACTURE SURGERY Left    UTERINE FIBROID SURGERY     Patient Active Problem List   Diagnosis Date Noted   Lumbosacral radiculopathy 06/21/2016   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   Unsteady gait 02/29/2016   Nausea alone 02/05/2013   Hypertension 02/03/2013    Left hand pain 02/03/2013   Cough 02/03/2013    ONSET DATE: 12/09/2022  REFERRING DIAG: Z91.81 (ICD-10-CM) - History of falling   THERAPY DIAG:  Muscle weakness (generalized)  Difficulty in walking, not elsewhere classified  Unsteadiness on feet  Other abnormalities of gait and mobility  Rationale for Evaluation and Treatment: Rehabilitation  SUBJECTIVE:  SUBJECTIVE STATEMENT: Had a busy week last week. Has been trying her exercises at home. Trying to do them every morning.   Pt accompanied by: self  PERTINENT HISTORY: PMH: Anemia, Anxiety, Arthritis, HLD, HTN, prediabetes, stable angina, hiatal hernia   PAIN:  Are you having pain? No   There were no vitals filed for this visit.    PRECAUTIONS: Fall  WEIGHT BEARING RESTRICTIONS: No  FALLS: Has patient fallen in last 6 months? Yes. Number of falls 1, and 3 almost falls   LIVING ENVIRONMENT: Lives with: lives with their son, (son is disabled, pt is son's primary caregiver) Lives in: House/apartment Stairs: Yes: External: 4 steps; on left going up, broke one of the other railings after a fall, looking into getting a new one. 2 steps in the back with a railing  Has following equipment at home: Single point cane, Walker - 4 wheeled, shower chair, and multiple canes, SPC with 4 prong tip  Sometimes uses rollator in the morning.   PLOF: Independent and Independent with community mobility with device  PATIENT GOALS: Wants to learn some exercises, get stronger   OBJECTIVE:   COGNITION: Overall cognitive status: Within functional limits for tasks assessed   SENSATION: Light touch: WFL Pt reports some numbness/tingling in her feet. As well as gets numbness and swelling in R lower leg.   COORDINATION: Heel to shin: WFL, soreness in  R knee    POSTURE: rounded shoulders, forward head, increased thoracic kyphosis, posterior pelvic tilt, and flexed trunk   LOWER EXTREMITY ROM:     Pt with decr knee extension AROM in sitting RLE>LLE  LOWER EXTREMITY MMT:    MMT Right Eval Left Eval  Hip flexion 4 4-  Hip extension    Hip abduction 3+ 3+  Hip adduction 5 5  Hip internal rotation    Hip external rotation    Knee flexion 4+ 5  Knee extension 4 4  Ankle dorsiflexion 5 5  Ankle plantarflexion    Ankle inversion    Ankle eversion    (Blank rows = not tested)  All tested in sitting.   TRANSFERS: Assistive device utilized: None  Sit to stand: SBA Stand to sit: SBA Can perform without UE support, hands on knees, BLE bracing against chair  Primarily performs with UE support    FUNCTIONAL TESTS:  5 times sit to stand: 25 seconds no UE support with hands on knees, BLE bracing against chair  Timed up and go (TUG): 21.62 seconds with SPC with 4 prong tip  10 meter walk test: 21.8 seconds = 1.50 ft/sec with SPC with 4 prong tip    TODAY'S TREATMENT:                        GAIT: Gait pattern: step through pattern, decreased arm swing- Left, decreased stride length, decreased hip/knee flexion- Right, decreased hip/knee flexion- Left, decreased ankle dorsiflexion- Right, decreased ankle dorsiflexion- Left, Right foot flat, Left foot flat, and trunk flexed Distance walked: Clinic distances  Assistive device utilized: Single point cane with 4 prong tip Level of assistance: SBA Comments: Pt with incr neck flexion and looks down at floor during gait, cues to look up and straight ahead during session.  Therapeutic Activity: Goal Assessment: 5x sit <> stand: 17 seconds no UE support with hands on knees, forward flexed posture in standing  Gait speed: 22.9 seconds with SPC with 4 prong tip = 1.43  ft/sec  Therapeutic Exercise: SciFit with BUE/BLE at gear 2.5 for 8 minutes for strengthening, aerobic warmup, endurance, ROM  At bottom of staircase: Alternating SLS taps to 6" step x10 reps each side, then to 2nd step x10 reps each side for incr SLS time and weight shifting  Alternating step ups x5 reps each side, pt needing to use the railing for support  At edge of mat table on air ex: 10 reps sit <> stands with no UE support for immediate standing balance, cues for posture ahead and eccentric control back down to mat table Alternating lateral stepping on and off x10 reps each side, no UE support  Heel toe raises with no UE support x10 reps    PATIENT EDUCATION: Education details: Continue HEP, results of goals.  Person educated: Patient Education method: Medical illustrator Education comprehension: verbalized understanding and returned demonstration  HOME EXERCISE PROGRAM: Access Code: [email or web address] URL: [email or web address] Date: 01/15/2023 Prepared by: Sherlie Ban  Exercises - Sit to Stand  - 2 x daily - 5 x weekly - 1 sets - 10 reps - Seated Hamstring Stretch  - 1-2 x daily - 5 x weekly - 3 sets - 30 hold - Standing March with Counter Support  - 2 x daily - 5 x weekly - 1-2 sets - 10 reps - Standing Hip Abduction with Unilateral Counter Support  - 2 x daily - 5 x weekly - 1-2 sets - 10 reps - Supine Bridge  - 1-2 x daily - 5 x weekly - 1-2 sets - 10 reps  GOALS: Goals reviewed with patient? Yes  SHORT TERM GOALS: Target date: 01/29/2023  BERG to be assessed with LTG written. Baseline: 42/56 Goal status: MET  2.  Pt will verbalize understanding of fall prevention. Baseline:  Goal status: INITIAL  3.  Pt will improve gait speed with LRAD to at least 1.8 ft/sec in order to demo improved community mobility.  Baseline: 21.8 seconds = 1.50 ft/sec with SPC with 4 prong tip   22.9 seconds with SPC with 4 prong tip = 1.43 ft/sec Goal status: NOT  MET  4.  Pt will improve 5x sit<>stand to less than or equal to 22 sec to demonstrate improved functional strength and transfer efficiency.  Baseline: 25 seconds no UE support with hands on knees, BLE bracing against chair  17 seconds no UE support with hands on knees, forward flexed posture in standing  Goal status: MET    LONG TERM GOALS: Target date: 02/26/2023  Pt will be independent with final HEP in order to build upon functional gains made in therapy. Baseline:  Goal status: INITIAL  2.  Pt will improve BERG to at least a 47/56 in order to demo decr fall risk. Baseline: 42/56 Goal status: INITIAL  3.  Pt will improve gait speed with LRAD to at least 1.8 ft/sec in order to demo improved community mobility.  Baseline:  21.8 seconds = 1.50 ft/sec with SPC with 4 prong tip  Goal status: REVISED  4.  Pt will improve 5x sit<>stand to less than or equal to 18 sec to demonstrate improved functional strength and transfer efficiency.  Baseline:  25 seconds no UE support with hands on knees, BLE bracing against chair Goal status: INITIAL  5.  Pt will  improve TUG time to 17 seconds or less in order to demo decrease fall risk. Baseline:  21.62 seconds with SPC with 4 prong tip  Goal status: INITIAL    ASSESSMENT:  CLINICAL IMPRESSION: Checked pt's STGs with pt meeting STG #4 in regards to 5x sit <> stand. Pt improved time without using hands from 22 seconds > 17 seconds, but still with incr forward flexed posture in standing. Pt did not meet STG #3, pt with no change in gait speed compared to eval. However, this is only pt's 2nd PT session as she had to miss 2 previous appts. Remainder of session focused on functional strength, endurance, and standing balance. Pt needing intermittent seated rest breaks during session due to fatigue. Will continue to progress towards LTGs .   OBJECTIVE IMPAIRMENTS: Abnormal gait, decreased activity tolerance, decreased balance, decreased coordination,  decreased endurance, decreased mobility, difficulty walking, decreased ROM, decreased strength, decreased safety awareness, impaired flexibility, impaired sensation, and postural dysfunction.   ACTIVITY LIMITATIONS: carrying, lifting, squatting, stairs, transfers, and locomotion level  PARTICIPATION LIMITATIONS: shopping, community activity, and yard work  PERSONAL FACTORS: Age, Behavior pattern, Past/current experiences, Time since onset of injury/illness/exacerbation, and 3+ comorbidities: Anemia, Anxiety, Arthritis, HLD, HTN, prediabetes, stable angina  are also affecting patient's functional outcome.   REHAB POTENTIAL: Good  CLINICAL DECISION MAKING: Evolving/moderate complexity  EVALUATION COMPLEXITY: Moderate  PLAN:  PT FREQUENCY: 1x/week  PT DURATION: 8 weeks  PLANNED INTERVENTIONS: Therapeutic exercises, Therapeutic activity, Neuromuscular re-education, Balance training, Gait training, Patient/Family education, Self Care, Stair training, Vestibular training, DME instructions, and Re-evaluation  PLAN FOR NEXT SESSION: Add to HEP as able. Work on sit <> stands, hip ABD strength, functional strength and balance. SciFit    Drake Leach, PT, DPT  01/29/2023, 2:25 PM

## 2023-02-05 ENCOUNTER — Encounter: Payer: Self-pay | Admitting: Physical Therapy

## 2023-02-05 ENCOUNTER — Ambulatory Visit: Payer: Medicare HMO | Admitting: Physical Therapy

## 2023-02-05 VITALS — BP 118/69 | HR 80

## 2023-02-05 DIAGNOSIS — M6281 Muscle weakness (generalized): Secondary | ICD-10-CM | POA: Diagnosis not present

## 2023-02-05 DIAGNOSIS — R262 Difficulty in walking, not elsewhere classified: Secondary | ICD-10-CM

## 2023-02-05 DIAGNOSIS — R2689 Other abnormalities of gait and mobility: Secondary | ICD-10-CM

## 2023-02-05 DIAGNOSIS — R2681 Unsteadiness on feet: Secondary | ICD-10-CM

## 2023-02-05 NOTE — Therapy (Signed)
OUTPATIENT PHYSICAL THERAPY NEURO TREATMENT   Patient Name: Nancy Maddox MRN: [id number] DOB:May 06, 1946, 77 y.o., female Today's Date: 02/05/2023   PCP: Harvest Forest, MD  REFERRING PROVIDER: Loney Laurence, NP   END OF SESSION:   02/05/23 1402  PT Visits / Re-Eval  Visit Number 4  Number of Visits 9  Date for PT Re-Evaluation 03/02/23  Authorization  Authorization Type [employer] - 9 visits 01/01/23 - 02/26/23  PT Time Calculation  PT Start Time 1400  PT Stop Time 1445  PT Time Calculation (min) 45 min  PT - End of Session  Equipment Utilized During Treatment Gait belt  Activity Tolerance Patient tolerated treatment well  Behavior During Therapy WFL for tasks assessed/performed    Past Medical History:  Diagnosis Date   Anemia    Anxiety    Arthritis    Complication of anesthesia    patient states that she was given too much anesthesia, too quickly prior to a C-section and could not see for 48 hours   Gallstones    GERD (gastroesophageal reflux disease)    Headache    History of hiatal hernia    HLD (hyperlipidemia)    Hypertension    Pneumonia    PONV (postoperative nausea and vomiting)    Prediabetes    Pulmonary edema    in setting of pregnancy '78   Past Surgical History:  Procedure Laterality Date   BREAST EXCISIONAL BIOPSY Right    CARPAL TUNNEL RELEASE Left 05/2017   CESAREAN SECTION     CHOLECYSTECTOMY N/A 11/03/2017   Procedure: LAPAROSCOPIC CHOLECYSTECTOMY WITH ATTEMPTED INTRAOPERATIVE CHOLANGIOGRAM;  Surgeon: Violeta Gelinas, MD;  Location: [facility] OR;  Service: General;  Laterality: N/A;   COLONOSCOPY WITH ESOPHAGOGASTRODUODENOSCOPY (EGD)     FEMUR FRACTURE SURGERY Left    UTERINE FIBROID SURGERY     Patient Active Problem List   Diagnosis Date Noted   Lumbosacral radiculopathy 06/21/2016   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   Unsteady gait 02/29/2016   Nausea alone 02/05/2013    Hypertension 02/03/2013   Left hand pain 02/03/2013   Cough 02/03/2013    ONSET DATE: 12/09/2022  REFERRING DIAG: Z91.81 (ICD-10-CM) - History of falling   THERAPY DIAG:  Muscle weakness (generalized)  Difficulty in walking, not elsewhere classified  Unsteadiness on feet  Other abnormalities of gait and mobility  Rationale for Evaluation and Treatment: Rehabilitation  SUBJECTIVE:  SUBJECTIVE STATEMENT: Had a busy week last week. Has been trying her exercises at home. Trying to do them every morning.   Pt accompanied by: self  PERTINENT HISTORY: PMH: Anemia, Anxiety, Arthritis, HLD, HTN, prediabetes, stable angina, hiatal hernia   PAIN:  Are you having pain? Yes: NPRS scale: 5/10 Pain location: left hand and bilateral knees Pain description: swelling in the knees and pins in the hand Aggravating factors: nothing Relieving factors: Tylenol    RUE in sitting prior to session: Vitals:   02/05/23 1405  BP: 118/69  Pulse: 80    PRECAUTIONS: Fall  WEIGHT BEARING RESTRICTIONS: No  FALLS: Has patient fallen in last 6 months? Yes. Number of falls 1, and 3 almost falls   LIVING ENVIRONMENT: Lives with: lives with their son, (son is disabled, pt is son's primary caregiver) Lives in: House/apartment Stairs: Yes: External: 4 steps; on left going up, broke one of the other railings after a fall, looking into getting a new one. 2 steps in the back with a railing  Has following equipment at home: Single point cane, Walker - 4 wheeled, shower chair, and multiple canes, SPC with 4 prong tip  Sometimes uses rollator in the morning.   PLOF: Independent and Independent with community mobility with device  PATIENT GOALS: Wants to learn some exercises, get stronger   OBJECTIVE:    COGNITION: Overall cognitive status: Within functional limits for tasks assessed   SENSATION: Light touch: WFL Pt reports some numbness/tingling in her feet. As well as gets numbness and swelling in R lower leg.   COORDINATION: Heel to shin: WFL, soreness in R knee    POSTURE: rounded shoulders, forward head, increased thoracic kyphosis, posterior pelvic tilt, and flexed trunk   LOWER EXTREMITY ROM:     Pt with decr knee extension AROM in sitting RLE>LLE  LOWER EXTREMITY MMT:    MMT Right Eval Left Eval  Hip flexion 4 4-  Hip extension    Hip abduction 3+ 3+  Hip adduction 5 5  Hip internal rotation    Hip external rotation    Knee flexion 4+ 5  Knee extension 4 4  Ankle dorsiflexion 5 5  Ankle plantarflexion    Ankle inversion    Ankle eversion    (Blank rows = not tested)  All tested in sitting.   TRANSFERS: Assistive device utilized: None  Sit to stand: SBA Stand to sit: SBA Can perform without UE support, hands on knees, BLE bracing against chair  Primarily performs with UE support    FUNCTIONAL TESTS:  5 times sit to stand: 25 seconds no UE support with hands on knees, BLE bracing against chair  Timed up and go (TUG): 21.62 seconds with SPC with 4 prong tip  10 meter walk test: 21.8 seconds = 1.50 ft/sec with SPC with 4 prong tip    TODAY'S TREATMENT:                      -Pt verbalizes fall prevention strategies:  using cane instead of furniture walking and doing exercises to maintain strength.  PT reminds pt of other strategies including eliminating throw rugs, adequate lighting especially for transitions in home and having a night light, and moving furniture that could be a trip hazard.    -STS no UE support x15, edu on scooting closer to the edge for leverage -Standing hip abduction 2x10 each LE at counter -Side-stepping w/ red theraband at thighs 2x10' each direction -Mini squats  on airex w/ RUE support due to right knee soreness requiring  seated rest b/w sets, 2x8 -4" step up and over w/ retro step in // bars using BUE support x12 each LE, mild bilateral knee soreness during task -SciFit x 8 minutes using BUE/BLE on level 2.0 for generalized activity tolerance.  Pt states this feels good on her knees.  Cued to maintain large amplitude reciprocal movement.  PATIENT EDUCATION: Education details: Continue HEP.  Try ice on knees for soreness before or following activity no more than 20 minutes w/ layers b/w ice and skin.  May also try heat if tolerated.  Explained BERG score from session prior per pt request. Person educated: Patient Education method: Explanation and Demonstration Education comprehension: verbalized understanding and returned demonstration  HOME EXERCISE PROGRAM: Access Code: [email or web address] URL: [email or web address] Date: 01/15/2023 Prepared by: Sherlie Ban  Exercises - Sit to Stand  - 2 x daily - 5 x weekly - 1 sets - 10 reps - Seated Hamstring Stretch  - 1-2 x daily - 5 x weekly - 3 sets - 30 hold - Standing March with Counter Support  - 2 x daily - 5 x weekly - 1-2 sets - 10 reps - Standing Hip Abduction with Unilateral Counter Support  - 2 x daily - 5 x weekly - 1-2 sets - 10 reps - Supine Bridge  - 1-2 x daily - 5 x weekly - 1-2 sets - 10 reps  GOALS: Goals reviewed with patient? Yes  SHORT TERM GOALS: Target date: 01/29/2023  BERG to be assessed with LTG written. Baseline: 42/56 Goal status: MET  2.  Pt will verbalize understanding of fall prevention. Baseline: Reviewed w/ teachback 5/8 Goal status: IN PROGRESS  3.  Pt will improve gait speed with LRAD to at least 1.8 ft/sec in order to demo improved community mobility.  Baseline: 21.8 seconds = 1.50 ft/sec with SPC with 4 prong tip   22.9 seconds with SPC with 4 prong tip = 1.43 ft/sec Goal status: NOT MET  4.  Pt will improve 5x sit<>stand to less than or equal to 22 sec to demonstrate improved functional strength and transfer  efficiency.  Baseline: 25 seconds no UE support with hands on knees, BLE bracing against chair  17 seconds no UE support with hands on knees, forward flexed posture in standing  Goal status: MET    LONG TERM GOALS: Target date: 02/26/2023  Pt will be independent with final HEP in order to build upon functional gains made in therapy. Baseline:  Goal status: INITIAL  2.  Pt will improve BERG to at least a 47/56 in order to demo decr fall risk. Baseline: 42/56 Goal status: INITIAL  3.  Pt will improve gait speed with LRAD to at least 1.8 ft/sec in order to demo improved community mobility.  Baseline:  21.8 seconds = 1.50 ft/sec with SPC with 4 prong tip  Goal status: REVISED  4.  Pt will improve 5x sit<>stand to less than or equal to 18 sec to demonstrate improved functional strength and transfer efficiency.  Baseline:  25 seconds no UE support with hands on knees, BLE bracing against chair Goal status: INITIAL  5.  Pt will improve TUG time to 17 seconds or less in order to demo decrease fall risk. Baseline:  21.62 seconds with SPC with 4 prong tip  Goal status: INITIAL    ASSESSMENT:  CLINICAL IMPRESSION: Emphasis of skilled session on ongoing therapeutic exercise to build functional strength and tolerance to  mobility.  Pt continues to be limited by bilateral knee pain and poor trunk posture during all upright activity.  She continues to benefit from skilled PT to further progress activity tolerance, dynamic balance, and safety with functional mobility as outlined in ongoing POC.  OBJECTIVE IMPAIRMENTS: Abnormal gait, decreased activity tolerance, decreased balance, decreased coordination, decreased endurance, decreased mobility, difficulty walking, decreased ROM, decreased strength, decreased safety awareness, impaired flexibility, impaired sensation, and postural dysfunction.   ACTIVITY LIMITATIONS: carrying, lifting, squatting, stairs, transfers, and locomotion  level  PARTICIPATION LIMITATIONS: shopping, community activity, and yard work  PERSONAL FACTORS: Age, Behavior pattern, Past/current experiences, Time since onset of injury/illness/exacerbation, and 3+ comorbidities: Anemia, Anxiety, Arthritis, HLD, HTN, prediabetes, stable angina  are also affecting patient's functional outcome.   REHAB POTENTIAL: Good  CLINICAL DECISION MAKING: Evolving/moderate complexity  EVALUATION COMPLEXITY: Moderate  PLAN:  PT FREQUENCY: 1x/week  PT DURATION: 8 weeks  PLANNED INTERVENTIONS: Therapeutic exercises, Therapeutic activity, Neuromuscular re-education, Balance training, Gait training, Patient/Family education, Self Care, Stair training, Vestibular training, DME instructions, and Re-evaluation  PLAN FOR NEXT SESSION: Add to HEP as able. Work on sit <> stands, hip ABD strength, functional strength and balance. SciFit    Sadie Haber, PT, DPT  02/05/2023, 2:08 PM

## 2023-02-12 ENCOUNTER — Encounter: Payer: Self-pay | Admitting: Physical Therapy

## 2023-02-12 ENCOUNTER — Ambulatory Visit: Payer: Medicare HMO | Admitting: Physical Therapy

## 2023-02-12 VITALS — BP 124/65 | HR 81

## 2023-02-12 DIAGNOSIS — R2689 Other abnormalities of gait and mobility: Secondary | ICD-10-CM

## 2023-02-12 DIAGNOSIS — R2681 Unsteadiness on feet: Secondary | ICD-10-CM

## 2023-02-12 DIAGNOSIS — M6281 Muscle weakness (generalized): Secondary | ICD-10-CM | POA: Diagnosis not present

## 2023-02-12 DIAGNOSIS — R262 Difficulty in walking, not elsewhere classified: Secondary | ICD-10-CM

## 2023-02-12 NOTE — Therapy (Signed)
OUTPATIENT PHYSICAL THERAPY NEURO TREATMENT   Patient Name: Nancy Maddox MRN: [id number] DOB:Jun 03, 1946, 78 y.o., female Today's Date: 02/12/2023   PCP: Harvest Forest, MD  REFERRING PROVIDER: Loney Laurence, NP   END OF SESSION:   PT End of Session - 02/12/23 1322     Visit Number 5    Number of Visits 9    Date for PT Re-Evaluation 03/02/23    Authorization Type [employer] - 9 visits 01/01/23 - 02/26/23    PT Start Time 1317    PT Stop Time 1400    PT Time Calculation (min) 43 min    Equipment Utilized During Treatment Gait belt    Activity Tolerance Patient tolerated treatment well    Behavior During Therapy WFL for tasks assessed/performed            Past Medical History:  Diagnosis Date   Anemia    Anxiety    Arthritis    Complication of anesthesia    patient states that she was given too much anesthesia, too quickly prior to a C-section and could not see for 48 hours   Gallstones    GERD (gastroesophageal reflux disease)    Headache    History of hiatal hernia    HLD (hyperlipidemia)    Hypertension    Pneumonia    PONV (postoperative nausea and vomiting)    Prediabetes    Pulmonary edema    in setting of pregnancy '78   Past Surgical History:  Procedure Laterality Date   BREAST EXCISIONAL BIOPSY Right    CARPAL TUNNEL RELEASE Left 05/2017   CESAREAN SECTION     CHOLECYSTECTOMY N/A 11/03/2017   Procedure: LAPAROSCOPIC CHOLECYSTECTOMY WITH ATTEMPTED INTRAOPERATIVE CHOLANGIOGRAM;  Surgeon: Violeta Gelinas, MD;  Location: [facility] OR;  Service: General;  Laterality: N/A;   COLONOSCOPY WITH ESOPHAGOGASTRODUODENOSCOPY (EGD)     FEMUR FRACTURE SURGERY Left    UTERINE FIBROID SURGERY     Patient Active Problem List   Diagnosis Date Noted   Lumbosacral radiculopathy 06/21/2016   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   Unsteady gait 02/29/2016   Nausea alone 02/05/2013   Hypertension 02/03/2013    Left hand pain 02/03/2013   Cough 02/03/2013    ONSET DATE: 12/09/2022  REFERRING DIAG: Z91.81 (ICD-10-CM) - History of falling   THERAPY DIAG:  Muscle weakness (generalized)  Difficulty in walking, not elsewhere classified  Unsteadiness on feet  Other abnormalities of gait and mobility  Rationale for Evaluation and Treatment: Rehabilitation  SUBJECTIVE:  SUBJECTIVE STATEMENT: She states she has been working on her HEP at home.  She does not feel they are challenging for her, but she uses these to get her moving in the morning.  Pt accompanied by: self-son dropped her off this morning  PERTINENT HISTORY: PMH: Anemia, Anxiety, Arthritis, HLD, HTN, prediabetes, stable angina, hiatal hernia   PAIN:  Are you having pain? No - took a pain pill prior to session   RUE in sitting prior to session: Vitals:   02/12/23 1325  BP: 124/65  Pulse: 81   PRECAUTIONS: Fall  WEIGHT BEARING RESTRICTIONS: No  FALLS: Has patient fallen in last 6 months? Yes. Number of falls 1, and 3 almost falls   LIVING ENVIRONMENT: Lives with: lives with their son, (son is disabled, pt is son's primary caregiver) Lives in: House/apartment Stairs: Yes: External: 4 steps; on left going up, broke one of the other railings after a fall, looking into getting a new one. 2 steps in the back with a railing  Has following equipment at home: Single point cane, Walker - 4 wheeled, shower chair, and multiple canes, SPC with 4 prong tip  Sometimes uses rollator in the morning.   PLOF: Independent and Independent with community mobility with device  PATIENT GOALS: Wants to learn some exercises, get stronger   OBJECTIVE:   COGNITION: Overall cognitive status: Within functional limits for tasks assessed   SENSATION: Light  touch: WFL Pt reports some numbness/tingling in her feet. As well as gets numbness and swelling in R lower leg.   COORDINATION: Heel to shin: WFL, soreness in R knee    POSTURE: rounded shoulders, forward head, increased thoracic kyphosis, posterior pelvic tilt, and flexed trunk   LOWER EXTREMITY ROM:     Pt with decr knee extension AROM in sitting RLE>LLE  LOWER EXTREMITY MMT:    MMT Right Eval Left Eval  Hip flexion 4 4-  Hip extension    Hip abduction 3+ 3+  Hip adduction 5 5  Hip internal rotation    Hip external rotation    Knee flexion 4+ 5  Knee extension 4 4  Ankle dorsiflexion 5 5  Ankle plantarflexion    Ankle inversion    Ankle eversion    (Blank rows = not tested)  All tested in sitting.   TRANSFERS: Assistive device utilized: None  Sit to stand: SBA Stand to sit: SBA Can perform without UE support, hands on knees, BLE bracing against chair  Primarily performs with UE support    FUNCTIONAL TESTS:  5 times sit to stand: 25 seconds no UE support with hands on knees, BLE bracing against chair  Timed up and go (TUG): 21.62 seconds with SPC with 4 prong tip  10 meter walk test: 21.8 seconds = 1.50 ft/sec with SPC with 4 prong tip    TODAY'S TREATMENT:                      -SciFit in sprints mode for dynamic HIIT style cardiovascular warmup and LE ROM x8 minutes at level 4.0 using BUE/BLE. -STS on airex 2x10 CGA progressed to SBA without UE support; pt requires prolonged seated rest following task due to fatigue without hand support -6" step ups using LUE support only to mimic home setup x8 each LE -Lateral lunges at counter x6 each LE w/ return demo  PATIENT EDUCATION: Education details: Continue HEP-addition of stair step ups for practice and functional strengthening. Person educated: Patient Education method: Explanation  and Demonstration Education comprehension: verbalized understanding and returned demonstration  HOME EXERCISE PROGRAM: Access  Code: [email or web address] URL: [email or web address] Date: 02/12/2023 Prepared by: Camille Bal  Exercises - Sit to Stand  - 2 x daily - 5 x weekly - 1 sets - 10 reps - Seated Hamstring Stretch  - 1-2 x daily - 5 x weekly - 3 sets - 30 hold - Standing March with Counter Support  - 2 x daily - 5 x weekly - 1-2 sets - 10 reps - Standing Hip Abduction with Unilateral Counter Support  - 2 x daily - 5 x weekly - 1-2 sets - 10 reps - Supine Bridge  - 1-2 x daily - 5 x weekly - 1-2 sets - 10 reps - Side Lunge with Counter Support  - 1 x daily - 5 x weekly - 1-2 sets - 6 reps - Forward Step Up with Counter Support  - 1 x daily - 5 x weekly - 1-2 sets - 10 reps  GOALS: Goals reviewed with patient? Yes  SHORT TERM GOALS: Target date: 01/29/2023  BERG to be assessed with LTG written. Baseline: 42/56 Goal status: MET  2.  Pt will verbalize understanding of fall prevention. Baseline: Reviewed w/ teachback 5/8 Goal status: IN PROGRESS  3.  Pt will improve gait speed with LRAD to at least 1.8 ft/sec in order to demo improved community mobility.  Baseline: 21.8 seconds = 1.50 ft/sec with SPC with 4 prong tip   22.9 seconds with SPC with 4 prong tip = 1.43 ft/sec Goal status: NOT MET  4.  Pt will improve 5x sit<>stand to less than or equal to 22 sec to demonstrate improved functional strength and transfer efficiency.  Baseline: 25 seconds no UE support with hands on knees, BLE bracing against chair  17 seconds no UE support with hands on knees, forward flexed posture in standing  Goal status: MET    LONG TERM GOALS: Target date: 02/26/2023  Pt will be independent with final HEP in order to build upon functional gains made in therapy. Baseline:  Goal status: INITIAL  2.  Pt will improve BERG to at least a 47/56 in order to demo decr fall risk. Baseline: 42/56 Goal status: INITIAL  3.  Pt will improve gait speed with LRAD to at least 1.8 ft/sec in order to demo improved community  mobility.  Baseline:  21.8 seconds = 1.50 ft/sec with SPC with 4 prong tip  Goal status: REVISED  4.  Pt will improve 5x sit<>stand to less than or equal to 18 sec to demonstrate improved functional strength and transfer efficiency.  Baseline:  25 seconds no UE support with hands on knees, BLE bracing against chair Goal status: INITIAL  5.  Pt will improve TUG time to 17 seconds or less in order to demo decrease fall risk. Baseline:  21.62 seconds with SPC with 4 prong tip  Goal status: INITIAL    ASSESSMENT:  CLINICAL IMPRESSION: Focus of skilled session today on addressing general activity tolerance and improving functional mobility.  She continues to demonstrate postural abnormalities with upright mobility and is fatigued with low level activity.  Additions made to HEP this session to address these deficits and PT to continue POC to progress towards goals as able.  OBJECTIVE IMPAIRMENTS: Abnormal gait, decreased activity tolerance, decreased balance, decreased coordination, decreased endurance, decreased mobility, difficulty walking, decreased ROM, decreased strength, decreased safety awareness, impaired flexibility, impaired sensation, and postural dysfunction.   ACTIVITY LIMITATIONS: carrying, lifting, squatting, stairs,  transfers, and locomotion level  PARTICIPATION LIMITATIONS: shopping, community activity, and yard work  PERSONAL FACTORS: Age, Behavior pattern, Past/current experiences, Time since onset of injury/illness/exacerbation, and 3+ comorbidities: Anemia, Anxiety, Arthritis, HLD, HTN, prediabetes, stable angina  are also affecting patient's functional outcome.   REHAB POTENTIAL: Good  CLINICAL DECISION MAKING: Evolving/moderate complexity  EVALUATION COMPLEXITY: Moderate  PLAN:  PT FREQUENCY: 1x/week  PT DURATION: 8 weeks  PLANNED INTERVENTIONS: Therapeutic exercises, Therapeutic activity, Neuromuscular re-education, Balance training, Gait training,  Patient/Family education, Self Care, Stair training, Vestibular training, DME instructions, and Re-evaluation  PLAN FOR NEXT SESSION: Add to HEP as able. Work on sit <> stands, hip ABD strength, functional strength and balance. SciFit    Sadie Haber, PT, DPT  02/12/2023, 1:58 PM

## 2023-02-12 NOTE — Patient Instructions (Signed)
Access Code: [email or web address] URL: [email or web address] Date: 02/12/2023 Prepared by: Camille Bal  Exercises - Sit to Stand  - 2 x daily - 5 x weekly - 1 sets - 10 reps - Seated Hamstring Stretch  - 1-2 x daily - 5 x weekly - 3 sets - 30 hold - Standing March with Counter Support  - 2 x daily - 5 x weekly - 1-2 sets - 10 reps - Standing Hip Abduction with Unilateral Counter Support  - 2 x daily - 5 x weekly - 1-2 sets - 10 reps - Supine Bridge  - 1-2 x daily - 5 x weekly - 1-2 sets - 10 reps - Side Lunge with Counter Support  - 1 x daily - 5 x weekly - 1-2 sets - 6 reps - Forward Step Up with Counter Support  - 1 x daily - 5 x weekly - 1-2 sets - 10 reps

## 2023-02-19 ENCOUNTER — Ambulatory Visit: Payer: Medicare HMO | Admitting: Physical Therapy

## 2023-02-20 ENCOUNTER — Ambulatory Visit: Payer: Medicare HMO | Admitting: Physical Therapy

## 2023-02-20 ENCOUNTER — Encounter: Payer: Self-pay | Admitting: Physical Therapy

## 2023-02-20 DIAGNOSIS — M6281 Muscle weakness (generalized): Secondary | ICD-10-CM

## 2023-02-20 DIAGNOSIS — R262 Difficulty in walking, not elsewhere classified: Secondary | ICD-10-CM

## 2023-02-20 DIAGNOSIS — R2681 Unsteadiness on feet: Secondary | ICD-10-CM

## 2023-02-20 DIAGNOSIS — R2689 Other abnormalities of gait and mobility: Secondary | ICD-10-CM

## 2023-02-20 NOTE — Therapy (Signed)
OUTPATIENT PHYSICAL THERAPY NEURO TREATMENT   Patient Name: Nancy Maddox MRN: [id number] DOB:1945-12-04, 77 y.o., female Today's Date: 02/20/2023   PCP: Harvest Forest, MD  REFERRING PROVIDER: Loney Laurence, NP  END OF SESSION:   PT End of Session - 02/20/23 1114     Visit Number 6    Number of Visits 9    Date for PT Re-Evaluation 03/02/23    Authorization Type [employer] - 9 visits 01/01/23 - 02/26/23    PT Start Time 1102    PT Stop Time 1141    PT Time Calculation (min) 39 min    Equipment Utilized During Treatment Gait belt    Activity Tolerance Patient tolerated treatment well;Patient limited by fatigue;No increased pain    Behavior During Therapy WFL for tasks assessed/performed            Past Medical History:  Diagnosis Date   Anemia    Anxiety    Arthritis    Complication of anesthesia    patient states that she was given too much anesthesia, too quickly prior to a C-section and could not see for 48 hours   Gallstones    GERD (gastroesophageal reflux disease)    Headache    History of hiatal hernia    HLD (hyperlipidemia)    Hypertension    Pneumonia    PONV (postoperative nausea and vomiting)    Prediabetes    Pulmonary edema    in setting of pregnancy '78   Past Surgical History:  Procedure Laterality Date   BREAST EXCISIONAL BIOPSY Right    CARPAL TUNNEL RELEASE Left 05/2017   CESAREAN SECTION     CHOLECYSTECTOMY N/A 11/03/2017   Procedure: LAPAROSCOPIC CHOLECYSTECTOMY WITH ATTEMPTED INTRAOPERATIVE CHOLANGIOGRAM;  Surgeon: Violeta Gelinas, MD;  Location: [facility] OR;  Service: General;  Laterality: N/A;   COLONOSCOPY WITH ESOPHAGOGASTRODUODENOSCOPY (EGD)     FEMUR FRACTURE SURGERY Left    UTERINE FIBROID SURGERY     Patient Active Problem List   Diagnosis Date Noted   Lumbosacral radiculopathy 06/21/2016   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   Unsteady gait 02/29/2016   Nausea  alone 02/05/2013   Hypertension 02/03/2013   Left hand pain 02/03/2013   Cough 02/03/2013    ONSET DATE: 12/09/2022  REFERRING DIAG: Z91.81 (ICD-10-CM) - History of falling   THERAPY DIAG:  Muscle weakness (generalized)  Difficulty in walking, not elsewhere classified  Unsteadiness on feet  Other abnormalities of gait and mobility  Rationale for Evaluation and Treatment: Rehabilitation  SUBJECTIVE:  SUBJECTIVE STATEMENT: She states she took the bus today and feels tired after getting on and off of it.  She states she is doing well today.  She denies falls or near falls.  No acute changes.  Pt accompanied by: self  PERTINENT HISTORY: PMH: Anemia, Anxiety, Arthritis, HLD, HTN, prediabetes, stable angina, hiatal hernia   PAIN:  Are you having pain? No - reports bilateral hip soreness, but does not rate as pain  RUE in sitting prior to session: There were no vitals filed for this visit.  PRECAUTIONS: Fall  WEIGHT BEARING RESTRICTIONS: No  FALLS: Has patient fallen in last 6 months? Yes. Number of falls 1, and 3 almost falls   LIVING ENVIRONMENT: Lives with: lives with their son, (son is disabled, pt is son's primary caregiver) Lives in: House/apartment Stairs: Yes: External: 4 steps; on left going up, broke one of the other railings after a fall, looking into getting a new one. 2 steps in the back with a railing  Has following equipment at home: Single point cane, Walker - 4 wheeled, shower chair, and multiple canes, SPC with 4 prong tip  Sometimes uses rollator in the morning.   PLOF: Independent and Independent with community mobility with device  PATIENT GOALS: Wants to learn some exercises, get stronger   OBJECTIVE:   COGNITION: Overall cognitive status: Within functional  limits for tasks assessed   SENSATION: Light touch: WFL Pt reports some numbness/tingling in her feet. As well as gets numbness and swelling in R lower leg.   COORDINATION: Heel to shin: WFL, soreness in R knee    POSTURE: rounded shoulders, forward head, increased thoracic kyphosis, posterior pelvic tilt, and flexed trunk   LOWER EXTREMITY ROM:     Pt with decr knee extension AROM in sitting RLE>LLE  LOWER EXTREMITY MMT:    MMT Right Eval Left Eval  Hip flexion 4 4-  Hip extension    Hip abduction 3+ 3+  Hip adduction 5 5  Hip internal rotation    Hip external rotation    Knee flexion 4+ 5  Knee extension 4 4  Ankle dorsiflexion 5 5  Ankle plantarflexion    Ankle inversion    Ankle eversion    (Blank rows = not tested)  All tested in sitting.   TRANSFERS: Assistive device utilized: None  Sit to stand: SBA Stand to sit: SBA Can perform without UE support, hands on knees, BLE bracing against chair  Primarily performs with UE support    FUNCTIONAL TESTS:  5 times sit to stand: 25 seconds no UE support with hands on knees, BLE bracing against chair  Timed up and go (TUG): 21.62 seconds with SPC with 4 prong tip  10 meter walk test: 21.8 seconds = 1.50 ft/sec with SPC with 4 prong tip    TODAY'S TREATMENT:                      TherEx: -SciFit in single peak mode for general endurance and LE strength x8 minutes at level 6.0 using BUE/BLE. -4" lateral step-ups in // bars using BUE support 2x8 each LE -2" heel taps w/ BUE support in // bars 2x6 each LE  NMR: -Lateral step out and ipsilateral reach at counter x8 each side -20' x2 left and right stepping w/ simultaneous ball toss to wall w/ CGA  Pt requires monitored seated rest throughout due to fatigue.  PATIENT EDUCATION: Education details: Continue HEP and walking as frequently as tolerated  using cane for safety. Person educated: Patient Education method: Medical illustrator Education  comprehension: verbalized understanding and returned demonstration  HOME EXERCISE PROGRAM: Access Code: [email or web address] URL: [email or web address] Date: 02/12/2023 Prepared by: Camille Bal  Exercises - Sit to Stand  - 2 x daily - 5 x weekly - 1 sets - 10 reps - Seated Hamstring Stretch  - 1-2 x daily - 5 x weekly - 3 sets - 30 hold - Standing March with Counter Support  - 2 x daily - 5 x weekly - 1-2 sets - 10 reps - Standing Hip Abduction with Unilateral Counter Support  - 2 x daily - 5 x weekly - 1-2 sets - 10 reps - Supine Bridge  - 1-2 x daily - 5 x weekly - 1-2 sets - 10 reps - Side Lunge with Counter Support  - 1 x daily - 5 x weekly - 1-2 sets - 6 reps - Forward Step Up with Counter Support  - 1 x daily - 5 x weekly - 1-2 sets - 10 reps  GOALS: Goals reviewed with patient? Yes  SHORT TERM GOALS: Target date: 01/29/2023  BERG to be assessed with LTG written. Baseline: 42/56 Goal status: MET  2.  Pt will verbalize understanding of fall prevention. Baseline: Reviewed w/ teachback 5/8 Goal status: IN PROGRESS  3.  Pt will improve gait speed with LRAD to at least 1.8 ft/sec in order to demo improved community mobility.  Baseline: 21.8 seconds = 1.50 ft/sec with SPC with 4 prong tip   22.9 seconds with SPC with 4 prong tip = 1.43 ft/sec Goal status: NOT MET  4.  Pt will improve 5x sit<>stand to less than or equal to 22 sec to demonstrate improved functional strength and transfer efficiency.  Baseline: 25 seconds no UE support with hands on knees, BLE bracing against chair  17 seconds no UE support with hands on knees, forward flexed posture in standing  Goal status: MET    LONG TERM GOALS: Target date: 02/26/2023  Pt will be independent with final HEP in order to build upon functional gains made in therapy. Baseline:  Goal status: INITIAL  2.  Pt will improve BERG to at least a 47/56 in order to demo decr fall risk. Baseline: 42/56 Goal status:  INITIAL  3.  Pt will improve gait speed with LRAD to at least 1.8 ft/sec in order to demo improved community mobility.  Baseline:  21.8 seconds = 1.50 ft/sec with SPC with 4 prong tip  Goal status: REVISED  4.  Pt will improve 5x sit<>stand to less than or equal to 18 sec to demonstrate improved functional strength and transfer efficiency.  Baseline:  25 seconds no UE support with hands on knees, BLE bracing against chair Goal status: INITIAL  5.  Pt will improve TUG time to 17 seconds or less in order to demo decrease fall risk. Baseline:  21.62 seconds with SPC with 4 prong tip  Goal status: INITIAL    ASSESSMENT:  CLINICAL IMPRESSION: Continued emphasis of skilled PT on addressing general debility and imbalance.  Pt continues to require frequent rest between tasks as she is limited in progression of activity due to quick onset of fatigue.  Tasks with a low resistance and slow progression have worked well for her including SciFit without provoking pain and allowing her to challenge endurance safely.  She is to be assessed for needed re-cert vs discharge at next session.  OBJECTIVE IMPAIRMENTS: Abnormal gait, decreased activity tolerance, decreased  balance, decreased coordination, decreased endurance, decreased mobility, difficulty walking, decreased ROM, decreased strength, decreased safety awareness, impaired flexibility, impaired sensation, and postural dysfunction.   ACTIVITY LIMITATIONS: carrying, lifting, squatting, stairs, transfers, and locomotion level  PARTICIPATION LIMITATIONS: shopping, community activity, and yard work  PERSONAL FACTORS: Age, Behavior pattern, Past/current experiences, Time since onset of injury/illness/exacerbation, and 3+ comorbidities: Anemia, Anxiety, Arthritis, HLD, HTN, prediabetes, stable angina  are also affecting patient's functional outcome.   REHAB POTENTIAL: Good  CLINICAL DECISION MAKING: Evolving/moderate complexity  EVALUATION COMPLEXITY:  Moderate  PLAN:  PT FREQUENCY: 1x/week  PT DURATION: 8 weeks  PLANNED INTERVENTIONS: Therapeutic exercises, Therapeutic activity, Neuromuscular re-education, Balance training, Gait training, Patient/Family education, Self Care, Stair training, Vestibular training, DME instructions, and Re-evaluation  PLAN FOR NEXT SESSION: ASSESS LTGs-D/C?  Add to HEP as able. Work on sit <> stands, hip ABD strength, functional strength and balance. SciFit   Sadie Haber, PT, DPT  02/20/2023, 11:45 AM

## 2023-02-26 ENCOUNTER — Encounter: Payer: Self-pay | Admitting: Physical Therapy

## 2023-02-26 ENCOUNTER — Ambulatory Visit: Payer: Medicare HMO | Admitting: Physical Therapy

## 2023-02-26 DIAGNOSIS — M6281 Muscle weakness (generalized): Secondary | ICD-10-CM

## 2023-02-26 DIAGNOSIS — R262 Difficulty in walking, not elsewhere classified: Secondary | ICD-10-CM

## 2023-02-26 DIAGNOSIS — R2689 Other abnormalities of gait and mobility: Secondary | ICD-10-CM

## 2023-02-26 DIAGNOSIS — R2681 Unsteadiness on feet: Secondary | ICD-10-CM

## 2023-02-26 NOTE — Therapy (Signed)
OUTPATIENT PHYSICAL THERAPY NEURO TREATMENT/RE-CERT   Patient Name: Nancy Maddox MRN: [id number] DOB:Sep 13, 1946, 77 y.o., female Today's Date: 02/26/2023   PCP: Harvest Forest, MD  REFERRING PROVIDER: Loney Laurence, NP  END OF SESSION:   PT End of Session - 02/26/23 1313     Visit Number 7    Number of Visits 9    Date for PT Re-Evaluation 03/28/23    Authorization Type [employer] - 9 visits 01/01/23 - 02/26/23    PT Start Time 1312    PT Stop Time 1353    PT Time Calculation (min) 41 min    Equipment Utilized During Treatment Gait belt    Activity Tolerance Patient tolerated treatment well;Patient limited by fatigue;No increased pain    Behavior During Therapy WFL for tasks assessed/performed            Past Medical History:  Diagnosis Date   Anemia    Anxiety    Arthritis    Complication of anesthesia    patient states that she was given too much anesthesia, too quickly prior to a C-section and could not see for 48 hours   Gallstones    GERD (gastroesophageal reflux disease)    Headache    History of hiatal hernia    HLD (hyperlipidemia)    Hypertension    Pneumonia    PONV (postoperative nausea and vomiting)    Prediabetes    Pulmonary edema    in setting of pregnancy '78   Past Surgical History:  Procedure Laterality Date   BREAST EXCISIONAL BIOPSY Right    CARPAL TUNNEL RELEASE Left 05/2017   CESAREAN SECTION     CHOLECYSTECTOMY N/A 11/03/2017   Procedure: LAPAROSCOPIC CHOLECYSTECTOMY WITH ATTEMPTED INTRAOPERATIVE CHOLANGIOGRAM;  Surgeon: Violeta Gelinas, MD;  Location: [facility] OR;  Service: General;  Laterality: N/A;   COLONOSCOPY WITH ESOPHAGOGASTRODUODENOSCOPY (EGD)     FEMUR FRACTURE SURGERY Left    UTERINE FIBROID SURGERY     Patient Active Problem List   Diagnosis Date Noted   Lumbosacral radiculopathy 06/21/2016   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   Unsteady gait 02/29/2016    Nausea alone 02/05/2013   Hypertension 02/03/2013   Left hand pain 02/03/2013   Cough 02/03/2013    ONSET DATE: 12/09/2022  REFERRING DIAG: Z91.81 (ICD-10-CM) - History of falling   THERAPY DIAG:  Muscle weakness (generalized)  Difficulty in walking, not elsewhere classified  Other abnormalities of gait and mobility  Unsteadiness on feet  Rationale for Evaluation and Treatment: Rehabilitation  SUBJECTIVE:  SUBJECTIVE STATEMENT: Found a cat with kittens in her backyard over the weekend. Goes to doctor tomorrow for swelling in hands and knees. Took the bus today, so feeling more tired today.   Pt accompanied by: self  PERTINENT HISTORY: PMH: Anemia, Anxiety, Arthritis, HLD, HTN, prediabetes, stable angina, hiatal hernia   PAIN:  Are you having pain? No - reports bilateral hip soreness, but does not rate as pain   PRECAUTIONS: Fall  WEIGHT BEARING RESTRICTIONS: No  FALLS: Has patient fallen in last 6 months? Yes. Number of falls 1, and 3 almost falls   LIVING ENVIRONMENT: Lives with: lives with their son, (son is disabled, pt is son's primary caregiver) Lives in: House/apartment Stairs: Yes: External: 4 steps; on left going up, broke one of the other railings after a fall, looking into getting a new one. 2 steps in the back with a railing  Has following equipment at home: Single point cane, Walker - 4 wheeled, shower chair, and multiple canes, SPC with 4 prong tip  Sometimes uses rollator in the morning.   PLOF: Independent and Independent with community mobility with device  PATIENT GOALS: Wants to learn some exercises, get stronger   OBJECTIVE:   TODAY'S TREATMENT:                         Therapeutic Activity: Goal Assessment: 5x sit <> stand: 30.1 seconds with no UE  support TUG: 18.7 seconds with SPC with 4 prong tip  Gait speed: 20.41 seconds = 1.61 ft/sec with SPC with 4 prong tip    [facility] PT Assessment - 02/26/23 1328       Berg Balance Test   Sit to Stand Able to stand without using hands and stabilize independently    Standing Unsupported Able to stand safely 2 minutes    Sitting with Back Unsupported but Feet Supported on Floor or Stool Able to sit safely and securely 2 minutes    Stand to Sit Sits safely with minimal use of hands    Transfers Able to transfer safely, minor use of hands    Standing Unsupported with Eyes Closed Able to stand 10 seconds safely    Standing Unsupported with Feet Together Able to place feet together independently and stand 1 minute safely    From Standing, Reach Forward with Outstretched Arm Can reach confidently >25 cm (10")    From Standing Position, Pick up Object from Floor Unable to pick up and needs supervision    From Standing Position, Turn to Look Behind Over each Shoulder Looks behind from both sides and weight shifts well    Turn 360 Degrees Able to turn 360 degrees safely but slowly   9.6 to L, 9.4 to R   Standing Unsupported, Alternately Place Feet on Step/Stool Able to complete 4 steps without aid or supervision   33 SECONDS   Standing Unsupported, One Foot in Front Able to place foot tandem independently and hold 30 seconds    Standing on One Leg Able to lift leg independently and hold equal to or more than 3 seconds    Total Score 47    Berg comment: 47/56 = Moderate Fall Risk             On air ex:  10 reps sit <> stands without UE support   PATIENT EDUCATION: Education details: walking as frequently as tolerated using cane for safety, continue HEP, results of goals. Plan to re-cert for an additional  2 visits to cover visits that pt missed due to not having transportation  Person educated: Patient Education method: Medical illustrator Education comprehension: verbalized  understanding and returned demonstration  HOME EXERCISE PROGRAM: Access Code: [email or web address] URL: [email or web address] Date: 02/12/2023 Prepared by: Camille Bal  Exercises - Sit to Stand  - 2 x daily - 5 x weekly - 1 sets - 10 reps - Seated Hamstring Stretch  - 1-2 x daily - 5 x weekly - 3 sets - 30 hold - Standing March with Counter Support  - 2 x daily - 5 x weekly - 1-2 sets - 10 reps - Standing Hip Abduction with Unilateral Counter Support  - 2 x daily - 5 x weekly - 1-2 sets - 10 reps - Supine Bridge  - 1-2 x daily - 5 x weekly - 1-2 sets - 10 reps - Side Lunge with Counter Support  - 1 x daily - 5 x weekly - 1-2 sets - 6 reps - Forward Step Up with Counter Support  - 1 x daily - 5 x weekly - 1-2 sets - 10 reps  GOALS: Goals reviewed with patient? Yes    LONG TERM GOALS: Target date: 02/26/2023  Pt will be independent with final HEP in order to build upon functional gains made in therapy. Baseline:  Goal status: IN PROGRESS  2.  Pt will improve BERG to at least a 47/56 in order to demo decr fall risk. Baseline: 42/56; 47/56 on 02/26/23  Goal status: MET  3.  Pt will improve gait speed with LRAD to at least 1.8 ft/sec in order to demo improved community mobility.  Baseline:  21.8 seconds = 1.50 ft/sec with SPC with 4 prong tip   20.41 seconds = 1.61 ft/sec with SPC with 4 prong tip on 02/26/23 Goal status: NOT MET  4.  Pt will improve 5x sit<>stand to less than or equal to 18 sec to demonstrate improved functional strength and transfer efficiency.  Baseline:  25 seconds no UE support with hands on knees, BLE bracing against chair  30.1 seconds with no UE support on 02/26/23 Goal status: NOT MET  5.  Pt will improve TUG time to 17 seconds or less in order to demo decrease fall risk. Baseline:  21.62 seconds with SPC with 4 prong tip   18.7 seconds on 02/26/23 with SPC with 4 prong tip  Goal status: NOT MET   UPDATED/ONGOING LTGS FOR RE-CERT:  LONG TERM  GOALS: Target date: 03/26/2023  Pt will be independent with final HEP in order to build upon functional gains made in therapy. Baseline:  Goal status: IN PROGRESS   2.  Pt will improve 5x sit<>stand to less than or equal to 20 sec to demonstrate improved functional strength and transfer efficiency.  Baseline:  25 seconds no UE support with hands on knees, BLE bracing against chair  30.1 seconds with no UE support on 02/26/23 Goal status: REVISED  5.  Pt will improve TUG time to 17 seconds or less in order to demo decrease fall risk. Baseline:  21.62 seconds with SPC with 4 prong tip   18.7 seconds on 02/26/23 with SPC with 4 prong tip  Goal status: ON-GOING    ASSESSMENT:  CLINICAL IMPRESSION: Today's skilled session focused on assessing pt's LTGs. Pt did not meet LTGs #3-5. Pt improved gait speed and TUG with SPC with 4 prong tip, but not quite to goal level, still putting pt at an incr risk for falls. Pt incr  the time on her 5x sit <> stand without UE support. Pt reports that she is more fatigued today during session from having to take the bus. Pt met LTG #2 in regards to BERG, improved score from a 42/56 > 47/56, now that pt is at a moderate risk for falls. Pt would like to make up for the 2 missed appts she had due to transportation. Will re-cert to cover these 2 visits in order to finalize HEP and continue working on strength, endurance, gait, and balance to decr fall risk/improve functional mobility. LTGs updated/revised as appropriate.   OBJECTIVE IMPAIRMENTS: Abnormal gait, decreased activity tolerance, decreased balance, decreased coordination, decreased endurance, decreased mobility, difficulty walking, decreased ROM, decreased strength, decreased safety awareness, impaired flexibility, impaired sensation, and postural dysfunction.   ACTIVITY LIMITATIONS: carrying, lifting, squatting, stairs, transfers, and locomotion level  PARTICIPATION LIMITATIONS: shopping, community  activity, and yard work  PERSONAL FACTORS: Age, Behavior pattern, Past/current experiences, Time since onset of injury/illness/exacerbation, and 3+ comorbidities: Anemia, Anxiety, Arthritis, HLD, HTN, prediabetes, stable angina  are also affecting patient's functional outcome.   REHAB POTENTIAL: Good  CLINICAL DECISION MAKING: Evolving/moderate complexity  EVALUATION COMPLEXITY: Moderate  PLAN:  PT FREQUENCY: 1x/week  PT DURATION: 4 weeks  PLANNED INTERVENTIONS: Therapeutic exercises, Therapeutic activity, Neuromuscular re-education, Balance training, Gait training, Patient/Family education, Self Care, Stair training, Vestibular training, DME instructions, and Re-evaluation  PLAN FOR NEXT SESSION: finalize HEP. Plan to D/C at end of POC. Work on sit <> stands, hip ABD strength, functional strength and balance. SciFit   Drake Leach, PT, DPT  02/26/2023, 2:00 PM

## 2023-03-05 ENCOUNTER — Ambulatory Visit: Payer: Medicare HMO | Attending: Family Medicine | Admitting: Physical Therapy

## 2023-03-05 ENCOUNTER — Encounter: Payer: Self-pay | Admitting: Physical Therapy

## 2023-03-05 DIAGNOSIS — R2681 Unsteadiness on feet: Secondary | ICD-10-CM | POA: Insufficient documentation

## 2023-03-05 DIAGNOSIS — R2689 Other abnormalities of gait and mobility: Secondary | ICD-10-CM | POA: Diagnosis present

## 2023-03-05 DIAGNOSIS — M6281 Muscle weakness (generalized): Secondary | ICD-10-CM | POA: Insufficient documentation

## 2023-03-05 DIAGNOSIS — R262 Difficulty in walking, not elsewhere classified: Secondary | ICD-10-CM | POA: Insufficient documentation

## 2023-03-05 NOTE — Therapy (Signed)
OUTPATIENT PHYSICAL THERAPY NEURO TREATMENT   Patient Name: Nancy Maddox MRN: [id number] DOB:December 18, 1945, 77 y.o., female Today's Date: 03/05/2023   PCP: Harvest Forest, MD  REFERRING PROVIDER: Loney Laurence, NP  END OF SESSION:   PT End of Session - 03/05/23 1319     Visit Number 8    Number of Visits 9    Date for PT Re-Evaluation 03/28/23    Authorization Type [employer] - 9 visits 01/01/23 - 02/26/23    PT Start Time 1316    PT Stop Time 1357    PT Time Calculation (min) 41 min    Equipment Utilized During Treatment Gait belt    Activity Tolerance Patient tolerated treatment well;Patient limited by fatigue;No increased pain    Behavior During Therapy WFL for tasks assessed/performed            Past Medical History:  Diagnosis Date   Anemia    Anxiety    Arthritis    Complication of anesthesia    patient states that she was given too much anesthesia, too quickly prior to a C-section and could not see for 48 hours   Gallstones    GERD (gastroesophageal reflux disease)    Headache    History of hiatal hernia    HLD (hyperlipidemia)    Hypertension    Pneumonia    PONV (postoperative nausea and vomiting)    Prediabetes    Pulmonary edema    in setting of pregnancy '78   Past Surgical History:  Procedure Laterality Date   BREAST EXCISIONAL BIOPSY Right    CARPAL TUNNEL RELEASE Left 05/2017   CESAREAN SECTION     CHOLECYSTECTOMY N/A 11/03/2017   Procedure: LAPAROSCOPIC CHOLECYSTECTOMY WITH ATTEMPTED INTRAOPERATIVE CHOLANGIOGRAM;  Surgeon: Violeta Gelinas, MD;  Location: [facility] OR;  Service: General;  Laterality: N/A;   COLONOSCOPY WITH ESOPHAGOGASTRODUODENOSCOPY (EGD)     FEMUR FRACTURE SURGERY Left    UTERINE FIBROID SURGERY     Patient Active Problem List   Diagnosis Date Noted   Lumbosacral radiculopathy 06/21/2016   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   Unsteady gait 02/29/2016   Nausea  alone 02/05/2013   Hypertension 02/03/2013   Left hand pain 02/03/2013   Cough 02/03/2013    ONSET DATE: 12/09/2022  REFERRING DIAG: Z91.81 (ICD-10-CM) - History of falling   THERAPY DIAG:  Muscle weakness (generalized)  Difficulty in walking, not elsewhere classified  Other abnormalities of gait and mobility  Unsteadiness on feet  Rationale for Evaluation and Treatment: Rehabilitation  SUBJECTIVE:  SUBJECTIVE STATEMENT: Pt reports that her older sister had a fall and she is the hospital. Had a fall when she visiting her sister in the hospital. Didn't see a basket and fell over it. Was able to get up on her own. Only had some swelling in L hand and soreness in L arm.   Pt accompanied by: self  PERTINENT HISTORY: PMH: Anemia, Anxiety, Arthritis, HLD, HTN, prediabetes, stable angina, hiatal hernia   PAIN:  Are you having pain? No - reports bilateral hip soreness, but does not rate as pain   PRECAUTIONS: Fall  WEIGHT BEARING RESTRICTIONS: No  FALLS: Has patient fallen in last 6 months? Yes. Number of falls 1, and 3 almost falls   LIVING ENVIRONMENT: Lives with: lives with their son, (son is disabled, pt is son's primary caregiver) Lives in: House/apartment Stairs: Yes: External: 4 steps; on left going up, broke one of the other railings after a fall, looking into getting a new one. 2 steps in the back with a railing  Has following equipment at home: Single point cane, Walker - 4 wheeled, shower chair, and multiple canes, SPC with 4 prong tip  Sometimes uses rollator in the morning.   PLOF: Independent and Independent with community mobility with device  PATIENT GOALS: Wants to learn some exercises, get stronger   OBJECTIVE:   TODAY'S TREATMENT:                       Therapeutic  Exercise:  SciFit with BUE/BLE multi-peaks level 5 for 8 minutes for strengthening, endurance, activity tolerance  In // bars:  On blue foam beam: side stepping down and back x2 reps without UE support, incr postural sway and initial fear of losing balance On blue mat for compliant surface: marching down and back x2 reps for single leg stance stability, initially UE support > none  Side stepping with green t-band around thigh, down and back x1 rep, discontinued exercise as pt reporting sharp pain in R knee    PATIENT EDUCATION: Education details: walking as frequently as tolerated using cane for safety, continue HEP,plan for D/C at next session  Person educated: Patient Education method: Explanation and Demonstration Education comprehension: verbalized understanding and returned demonstration  HOME EXERCISE PROGRAM: Access Code: [email or web address] URL: [email or web address] Date: 02/12/2023 Prepared by: Camille Bal  Exercises - Sit to Stand  - 2 x daily - 5 x weekly - 1 sets - 10 reps - Seated Hamstring Stretch  - 1-2 x daily - 5 x weekly - 3 sets - 30 hold - Standing March with Counter Support  - 2 x daily - 5 x weekly - 1-2 sets - 10 reps - Standing Hip Abduction with Unilateral Counter Support  - 2 x daily - 5 x weekly - 1-2 sets - 10 reps - Supine Bridge  - 1-2 x daily - 5 x weekly - 1-2 sets - 10 reps - Side Lunge with Counter Support  - 1 x daily - 5 x weekly - 1-2 sets - 6 reps - Forward Step Up with Counter Support  - 1 x daily - 5 x weekly - 1-2 sets - 10 reps  GOALS: Goals reviewed with patient? Yes    UPDATED/ONGOING LTGS FOR RE-CERT:  LONG TERM GOALS: Target date: 03/26/2023  Pt will be independent with final HEP in order to build upon functional gains made in therapy. Baseline:  Goal status: IN PROGRESS   2.  Pt will improve 5x sit<>stand to  less than or equal to 20 sec to demonstrate improved functional strength and transfer efficiency.  Baseline:   25 seconds no UE support with hands on knees, BLE bracing against chair  30.1 seconds with no UE support on 02/26/23 Goal status: REVISED  5.  Pt will improve TUG time to 17 seconds or less in order to demo decrease fall risk. Baseline:  21.62 seconds with SPC with 4 prong tip   18.7 seconds on 02/26/23 with SPC with 4 prong tip  Goal status: ON-GOING    ASSESSMENT:  CLINICAL IMPRESSION: Today's skilled session focused on BLE strengthening, improving functional mobility, and balance tasks on compliant surfaces working on decr UE support. Pt with incr postural sway and fear with balance on unlevel surfaces. Pt needing intermittent seated rest breaks due to fatigue and decr activity tolerance. Will continue to progress towards LTGs. Plan to D/C at next session.   OBJECTIVE IMPAIRMENTS: Abnormal gait, decreased activity tolerance, decreased balance, decreased coordination, decreased endurance, decreased mobility, difficulty walking, decreased ROM, decreased strength, decreased safety awareness, impaired flexibility, impaired sensation, and postural dysfunction.   ACTIVITY LIMITATIONS: carrying, lifting, squatting, stairs, transfers, and locomotion level  PARTICIPATION LIMITATIONS: shopping, community activity, and yard work  PERSONAL FACTORS: Age, Behavior pattern, Past/current experiences, Time since onset of injury/illness/exacerbation, and 3+ comorbidities: Anemia, Anxiety, Arthritis, HLD, HTN, prediabetes, stable angina  are also affecting patient's functional outcome.   REHAB POTENTIAL: Good  CLINICAL DECISION MAKING: Evolving/moderate complexity  EVALUATION COMPLEXITY: Moderate  PLAN:  PT FREQUENCY: 1x/week  PT DURATION: 4 weeks  PLANNED INTERVENTIONS: Therapeutic exercises, Therapeutic activity, Neuromuscular re-education, Balance training, Gait training, Patient/Family education, Self Care, Stair training, Vestibular training, DME instructions, and Re-evaluation  PLAN FOR  NEXT SESSION: finalize HEP. Plan to D/C at end of POC. Work on sit <> stands, hip ABD strength, functional strength and balance. SciFit   Drake Leach, PT, DPT  03/05/2023, 2:02 PM

## 2023-03-12 ENCOUNTER — Ambulatory Visit: Payer: Medicare HMO | Admitting: Physical Therapy

## 2023-03-12 ENCOUNTER — Encounter: Payer: Self-pay | Admitting: Physical Therapy

## 2023-03-12 DIAGNOSIS — R2681 Unsteadiness on feet: Secondary | ICD-10-CM

## 2023-03-12 DIAGNOSIS — M6281 Muscle weakness (generalized): Secondary | ICD-10-CM | POA: Diagnosis not present

## 2023-03-12 DIAGNOSIS — R2689 Other abnormalities of gait and mobility: Secondary | ICD-10-CM

## 2023-03-12 DIAGNOSIS — R262 Difficulty in walking, not elsewhere classified: Secondary | ICD-10-CM

## 2023-03-12 NOTE — Therapy (Signed)
OUTPATIENT PHYSICAL THERAPY NEURO TREATMENT-DISCHARGE SUMMARY   Patient Name: Nancy Maddox MRN: [id number] DOB:04-28-1946, 77 y.o., female Today's Date: 03/12/2023   PCP: Harvest Forest, MD  REFERRING PROVIDER: Loney Laurence, NP  PHYSICAL THERAPY DISCHARGE SUMMARY  Visits from Start of Care: 9  Current functional level related to goals / functional outcomes: See clinical impression statement.   Remaining deficits: Variable pain/reported cramping vs joint pain, decreased gait speed, need for [facility] w/ quad tip for stability, mild LE weakness   Education / Equipment: Need for daily walking to maintain tolerance, HEP to supplement strengthening activities and maintain muscle length when sedentary.  Plan for D/C today and explanation for fluctuation in goal progress regarding variable muscle vs joint pain.    Patient agrees to discharge. Patient goals were partially met. Patient is being discharged due to maximized rehab potential.    END OF SESSION:   PT End of Session - 03/12/23 1317     Visit Number 9    Number of Visits 9    Date for PT Re-Evaluation 03/28/23    Authorization Type [employer] - 9 visits 01/01/23 - 02/26/23    PT Start Time 1317    PT Stop Time 1359    PT Time Calculation (min) 42 min    Equipment Utilized During Treatment Gait belt    Activity Tolerance Patient tolerated treatment well;No increased pain    Behavior During Therapy WFL for tasks assessed/performed            Past Medical History:  Diagnosis Date   Anemia    Anxiety    Arthritis    Complication of anesthesia    patient states that she was given too much anesthesia, too quickly prior to a C-section and could not see for 48 hours   Gallstones    GERD (gastroesophageal reflux disease)    Headache    History of hiatal hernia    HLD (hyperlipidemia)    Hypertension    Pneumonia    PONV (postoperative nausea and vomiting)    Prediabetes    Pulmonary edema    in setting  of pregnancy '78   Past Surgical History:  Procedure Laterality Date   BREAST EXCISIONAL BIOPSY Right    CARPAL TUNNEL RELEASE Left 05/2017   CESAREAN SECTION     CHOLECYSTECTOMY N/A 11/03/2017   Procedure: LAPAROSCOPIC CHOLECYSTECTOMY WITH ATTEMPTED INTRAOPERATIVE CHOLANGIOGRAM;  Surgeon: Violeta Gelinas, MD;  Location: [facility] OR;  Service: General;  Laterality: N/A;   COLONOSCOPY WITH ESOPHAGOGASTRODUODENOSCOPY (EGD)     FEMUR FRACTURE SURGERY Left    UTERINE FIBROID SURGERY     Patient Active Problem List   Diagnosis Date Noted   Lumbosacral radiculopathy 06/21/2016   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   Unsteady gait 02/29/2016   Nausea alone 02/05/2013   Hypertension 02/03/2013   Left hand pain 02/03/2013   Cough 02/03/2013    ONSET DATE: 12/09/2022  REFERRING DIAG: Z91.81 (ICD-10-CM) - History of falling   THERAPY DIAG:  Muscle weakness (generalized)  Difficulty in walking, not elsewhere classified  Other abnormalities of gait and mobility  Unsteadiness on feet  Rationale for Evaluation and Treatment: Rehabilitation  SUBJECTIVE:  SUBJECTIVE STATEMENT: Pt reports that her older sister is doing better but she has had a close cousin pass away today.  She is otherwise doing okay.  She ambulates into clinic rolling bookbag in RUE behind her and carrying cell phone and managing cane in LUE.  She denies recent changes or falls.   Pt accompanied by: self  PERTINENT HISTORY: PMH: Anemia, Anxiety, Arthritis, HLD, HTN, prediabetes, stable angina, hiatal hernia   PAIN:  Are you having pain? No - some RLE cramping that she attributes to her stumble against the trashcan about 2 weeks ago, but states these are improving.   PRECAUTIONS:  Fall  WEIGHT BEARING RESTRICTIONS: No  FALLS: Has patient fallen in last 6 months? Yes. Number of falls 1, and 3 almost falls   LIVING ENVIRONMENT: Lives with: lives with their son, (son is disabled, pt is son's primary caregiver) Lives in: House/apartment Stairs: Yes: External: 4 steps; on left going up, broke one of the other railings after a fall, looking into getting a new one. 2 steps in the back with a railing  Has following equipment at home: Single point cane, Walker - 4 wheeled, shower chair, and multiple canes, SPC with 4 prong tip  Sometimes uses rollator in the morning.   PLOF: Independent and Independent with community mobility with device  PATIENT GOALS: Wants to learn some exercises, get stronger   OBJECTIVE:   TODAY'S TREATMENT:                      -5xSTS (requires consistent cuing for continuous performance and x3 attempts to initiate w/o UE support prior to last attempt w/ BUE support):  21.22 seconds  TUG trials, all w/ quad tip SPC: -1:  22.72 seconds -2:  23.56 seconds -3:  20.25 seconds -AVERAGE:  22.18 seconds  Reviewed HEP: -STS no UE support x5 -Seated hamstring stretch x2 minutes each LE, pt requires return demo as she does not recall this exercise -Standing march w/ BUE support x20 -Standing hip abduction x10 each LE, cues to maintain abductor vs quad engagement -Lateral lunge x5 each side w/ BUE support -Forward 6" step up w/ LUE rail only w/ right cane to mimic home setup x8 alternating LE; cues for cane placement for safety and anterior weight shift onto step -Supine bridges x8 Provided additional printout of HEP per pt request.  PATIENT EDUCATION: Education details: Need for daily walking to maintain tolerance, HEP to supplement strengthening activities and maintain muscle length when sedentary.  Plan for D/C today and explanation for fluctuation in goal progress regarding variable muscle vs joint pain.  Person educated: Patient Education  method: Medical illustrator Education comprehension: verbalized understanding and returned demonstration  HOME EXERCISE PROGRAM: Access Code: [email or web address] URL: [email or web address] Date: 02/12/2023 Prepared by: Camille Bal  Exercises - Sit to Stand  - 2 x daily - 5 x weekly - 1 sets - 10 reps - Seated Hamstring Stretch  - 1-2 x daily - 5 x weekly - 3 sets - 30 hold - Standing March with Counter Support  - 2 x daily - 5 x weekly - 1-2 sets - 10 reps - Standing Hip Abduction with Unilateral Counter Support  - 2 x daily - 5 x weekly - 1-2 sets - 10 reps - Supine Bridge  - 1-2 x daily - 5 x weekly - 1-2 sets - 10 reps - Side Lunge with Counter Support  - 1 x daily - 5 x weekly - 1-2  sets - 6 reps - Forward Step Up with Counter Support  - 1 x daily - 5 x weekly - 1-2 sets - 10 reps  GOALS: Goals reviewed with patient? Yes    UPDATED/ONGOING LTGS FOR RE-CERT:  LONG TERM GOALS: Target date: 03/26/2023  Pt will be independent with final HEP in order to build upon functional gains made in therapy. Baseline:  Pt requires cues for safety and continuous repetition, but demonstrates understanding w/ review (6/12) Goal status: PARTIALLY MET  2.  Pt will improve 5x sit<>stand to less than or equal to 20 sec to demonstrate improved functional strength and transfer efficiency.  Baseline:  25 seconds no UE support with hands on knees, BLE bracing against chair  30.1 seconds with no UE support on 02/26/23  21.22 seconds w/ BUE support (6/12)  Goal status: PARTIALLY MET  5.  Pt will improve TUG time to 17 seconds or less in order to demo decrease fall risk. Baseline:  21.62 seconds with SPC with 4 prong tip   18.7 seconds on 02/26/23 with SPC with 4 prong tip   22.18 seconds with SPC with 4 prong tip (6/12)  Goal status: NOT MET  ASSESSMENT:  CLINICAL IMPRESSION: Assessed LTGs this visit in preparation for discharge.  HEP was reviewed with patient appearing to  have good understanding of goals of tasks and performance, but needed guidance to complete today.  Her average TUG time was 22.18 seconds with her cane which was a decline in performance possibly attributed to her normal fluctuation based on fatigue level.  She did require BUE support to complete the 5xSTS in 21.22 seconds which is a significant LE strength improvement.  Overall, patient is appropriate for and in agreement to discharging to self-management at home as she is safest with current AD and has maximized her rehab potential in this setting due to changes in fatigue impacting performance, variable pain levels, and maintained low level activity at home.  She is appropriate to return in the future with a new referral if her status changes.  OBJECTIVE IMPAIRMENTS: Abnormal gait, decreased activity tolerance, decreased balance, decreased coordination, decreased endurance, decreased mobility, difficulty walking, decreased ROM, decreased strength, decreased safety awareness, impaired flexibility, impaired sensation, and postural dysfunction.   ACTIVITY LIMITATIONS: carrying, lifting, squatting, stairs, transfers, and locomotion level  PARTICIPATION LIMITATIONS: shopping, community activity, and yard work  PERSONAL FACTORS: Age, Behavior pattern, Past/current experiences, Time since onset of injury/illness/exacerbation, and 3+ comorbidities: Anemia, Anxiety, Arthritis, HLD, HTN, prediabetes, stable angina  are also affecting patient's functional outcome.   REHAB POTENTIAL: Good  CLINICAL DECISION MAKING: Evolving/moderate complexity  EVALUATION COMPLEXITY: Moderate  PLAN:  PT FREQUENCY: 1x/week  PT DURATION: 4 weeks  PLANNED INTERVENTIONS: Therapeutic exercises, Therapeutic activity, Neuromuscular re-education, Balance training, Gait training, Patient/Family education, Self Care, Stair training, Vestibular training, DME instructions, and Re-evaluation  PLAN FOR NEXT SESSION: N/A  Sadie Haber, PT, DPT  03/12/2023, 2:00 PM

## 2023-10-02 ENCOUNTER — Encounter: Payer: Self-pay | Admitting: *Deleted

## 2023-10-02 NOTE — Progress Notes (Signed)
 Pt attended 08/22/2023 screening event where her b/p was 130/83. At the event the pt noted having a PCP at a family practice, having insurance, not smoking, and did not identify any SDOH insecurities. Chart review indicates pt's PCP of record is Dr. Ezekiel Charleston at [facility], and PCP last saw pt on 08/06/23, where her b/p was 116/72. Pt was also seen at her primary care clinic on 09/09/23 when her b/p was 121/55. Also per chart review, pt has future appt with her PCP on 11/04/23. No additional health equity team support indicated at this time.

## 2024-05-05 ENCOUNTER — Ambulatory Visit: Admitting: Physician Assistant

## 2024-05-05 ENCOUNTER — Encounter: Payer: Self-pay | Admitting: Physician Assistant

## 2024-05-05 VITALS — BP 126/70

## 2024-05-05 DIAGNOSIS — D492 Neoplasm of unspecified behavior of bone, soft tissue, and skin: Secondary | ICD-10-CM | POA: Diagnosis not present

## 2024-05-05 DIAGNOSIS — L821 Other seborrheic keratosis: Secondary | ICD-10-CM

## 2024-05-05 DIAGNOSIS — D485 Neoplasm of uncertain behavior of skin: Secondary | ICD-10-CM

## 2024-05-05 NOTE — Progress Notes (Signed)
   New Patient Visit   Subjective  Nancy Maddox is a 78 y.o. female who presents for the following: Spot of scalp. She had it removed about 10 years but it came back. It is a little painful and bleeds sometimes.    The following portions of the chart were reviewed this encounter and updated as appropriate: medications, allergies, medical history  Review of Systems:  No other skin or systemic complaints except as noted in HPI or Assessment and Plan.  Objective  Well appearing patient in no apparent distress; mood and affect are within normal limits.   A focused examination was performed of the following areas: scalp  Relevant exam findings are noted in the Assessment and Plan.  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 Epidermal / dermal shaving  Lesion diameter (cm):  1.6 Informed consent: discussed and consent obtained   Timeout: patient name, date of birth, surgical site, and procedure verified   Procedure prep:  Patient was prepped and draped in usual sterile fashion Prep type:  Isopropyl alcohol Anesthesia: the lesion was anesthetized in a standard fashion   Anesthetic:  1% lidocaine  w/ epinephrine  1-100,000 buffered w/ 8.4% NaHCO3 Instrument used: flexible razor blade   Hemostasis achieved with: pressure, aluminum chloride and electrodesiccation   Outcome: patient tolerated procedure well   Post-procedure details: sterile dressing applied and wound care instructions given   Dressing type: bandage and petrolatum    Specimen 1 - Surgical pathology 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  I, Gordan Beams, CMA, am acting as scribe for Shakendra Griffeth K, PA-C.   Documentation: I have reviewed the above  documentation for accuracy and completeness, and I agree with the above.  Emoni Yang K, PA-C

## 2024-05-05 NOTE — Progress Notes (Deleted)
   New Patient Visit   Subjective  Nancy Maddox is a 78 y.o. female who presents for the following: growth on head.   Nancy Maddox has a mole on her scalp she would like evaluated today. Her previous doctor removed it about ten years ago. She has another growth on right side of her scalp    The following portions of the chart were reviewed this encounter and updated as appropriate: medications, allergies, medical history  Review of Systems:  No other skin or systemic complaints except as noted in HPI or Assessment and Plan.  Objective  Well appearing patient in no apparent distress; mood and affect are within normal limits.  A focused examination was performed of the following areas: Scalp  Relevant exam findings are noted in the Assessment and Plan.    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  I, Gordan Beams, CMA, am acting as scribe for Darcella Shiffman K, PA-C.   Documentation: I have reviewed the above documentation for accuracy and completeness, and I agree with the above.  Dartanyon Frankowski K, PA-C

## 2024-05-10 ENCOUNTER — Ambulatory Visit: Payer: Self-pay | Admitting: Physician Assistant

## 2024-05-10 LAB — SURGICAL PATHOLOGY

## 2024-11-01 ENCOUNTER — Encounter: Payer: Self-pay | Admitting: Gastroenterology
